# Patient Record
Sex: Male | Born: 1960 | Race: Black or African American | Hispanic: No
Health system: Southern US, Community
[De-identification: ages and names within clinical notes are randomized; demographics above are authoritative.]

## PROBLEM LIST (undated history)

## (undated) ENCOUNTER — Emergency Department (HOSPITAL_BASED_OUTPATIENT_CLINIC_OR_DEPARTMENT_OTHER): Admission: EM | Payer: BLUE CROSS/BLUE SHIELD | Source: Home / Self Care

## (undated) DIAGNOSIS — I82409 Acute embolism and thrombosis of unspecified deep veins of unspecified lower extremity: Secondary | ICD-10-CM

## (undated) DIAGNOSIS — N529 Male erectile dysfunction, unspecified: Secondary | ICD-10-CM

## (undated) DIAGNOSIS — I480 Paroxysmal atrial fibrillation: Secondary | ICD-10-CM

## (undated) DIAGNOSIS — I10 Essential (primary) hypertension: Secondary | ICD-10-CM

## (undated) DIAGNOSIS — M79609 Pain in unspecified limb: Secondary | ICD-10-CM

## (undated) HISTORY — DX: Male erectile dysfunction, unspecified: N52.9

## (undated) HISTORY — DX: Pain in unspecified limb: M79.609

## (undated) HISTORY — PX: OTHER SURGICAL HISTORY: SHX169

## (undated) HISTORY — PX: PROSTATE SURGERY: SHX751

## (undated) HISTORY — DX: Paroxysmal atrial fibrillation: I48.0

---

## 2000-08-22 ENCOUNTER — Emergency Department (HOSPITAL_COMMUNITY): Admission: EM | Admit: 2000-08-22 | Discharge: 2000-08-22 | Payer: Self-pay | Admitting: Emergency Medicine

## 2001-07-25 ENCOUNTER — Emergency Department (HOSPITAL_COMMUNITY): Admission: EM | Admit: 2001-07-25 | Discharge: 2001-07-25 | Payer: Self-pay | Admitting: Emergency Medicine

## 2001-07-25 ENCOUNTER — Encounter: Payer: Self-pay | Admitting: Emergency Medicine

## 2002-03-04 ENCOUNTER — Emergency Department (HOSPITAL_COMMUNITY): Admission: EM | Admit: 2002-03-04 | Discharge: 2002-03-05 | Payer: Self-pay | Admitting: Emergency Medicine

## 2003-12-04 ENCOUNTER — Emergency Department (HOSPITAL_COMMUNITY): Admission: EM | Admit: 2003-12-04 | Discharge: 2003-12-04 | Payer: Self-pay | Admitting: Emergency Medicine

## 2004-09-23 ENCOUNTER — Emergency Department (HOSPITAL_COMMUNITY): Admission: EM | Admit: 2004-09-23 | Discharge: 2004-09-23 | Payer: Self-pay

## 2005-08-29 ENCOUNTER — Ambulatory Visit: Payer: Self-pay | Admitting: Gastroenterology

## 2005-09-13 ENCOUNTER — Ambulatory Visit: Payer: Self-pay | Admitting: Gastroenterology

## 2007-01-29 ENCOUNTER — Ambulatory Visit: Payer: Self-pay | Admitting: Cardiovascular Disease

## 2007-01-29 ENCOUNTER — Ambulatory Visit: Payer: Self-pay

## 2008-06-11 ENCOUNTER — Encounter: Admission: RE | Admit: 2008-06-11 | Discharge: 2008-06-11 | Payer: Self-pay | Admitting: *Deleted

## 2009-03-30 ENCOUNTER — Encounter (INDEPENDENT_AMBULATORY_CARE_PROVIDER_SITE_OTHER): Payer: Self-pay | Admitting: *Deleted

## 2009-11-16 ENCOUNTER — Encounter: Payer: Self-pay | Admitting: Cardiology

## 2009-11-18 ENCOUNTER — Telehealth: Payer: Self-pay | Admitting: Gastroenterology

## 2010-02-05 ENCOUNTER — Emergency Department (HOSPITAL_BASED_OUTPATIENT_CLINIC_OR_DEPARTMENT_OTHER): Admission: EM | Admit: 2010-02-05 | Discharge: 2010-02-05 | Payer: Self-pay | Admitting: Emergency Medicine

## 2010-02-05 ENCOUNTER — Ambulatory Visit: Payer: Self-pay | Admitting: Diagnostic Radiology

## 2010-12-05 NOTE — Letter (Signed)
Summary: Recall Colonoscopy Letter  [facility]  [address] [place], [place] [address]   Phone: [phone]  Fax: [phone]      Mar 30, 2009 MRN: [id number]   Kyle Ryan [address] [place], [place]  [address]   Dear [name],   According to your medical record, it is time for you to schedule a Colonoscopy. The American Cancer Society recommends this procedure as a method to detect early colon cancer. Patients with a family history of colon cancer, or a personal history of colon polyps or inflammatory bowel disease are at increased risk.  This letter has beeen generated based on the recommendations made at the time of your procedure. If you feel that in your particular situation this may no longer apply, please contact our office.  Please call our office at [phone] to schedule this appointment or to update your records at your earliest convenience.  Thank you for cooperating with [place] to provide you with the very best care possible.   Sincerely,  Judie Petit T. Russella Dar, M.D.  [facility] [phone]

## 2010-12-05 NOTE — Progress Notes (Signed)
Summary: Schedule Colonoscopy  Phone Note Outgoing Call Call back at [facility] Phone [phone]   Call placed by: Harlow Mares CMA Duncan Dull),  November 18, 2009 4:51 PM Call placed to: Patient Summary of Call: left message with male and she will have the patient call me back.  Initial call taken by: Harlow Mares CMA Duncan Dull),  November 18, 2009 4:52 PM  Follow-up for Phone Call        Left message on patients machine to call back.  Follow-up by: Harlow Mares CMA Duncan Dull),  November 25, 2009 10:46 AM  Additional Follow-up for Phone Call Additional follow up Details #1::        left message with patients wife again to  call back , she will remind him Additional Follow-up by: Harlow Mares CMA Duncan Dull),  December 01, 2009 2:04 PM

## 2011-02-01 ENCOUNTER — Other Ambulatory Visit: Payer: Self-pay | Admitting: *Deleted

## 2011-02-01 MED ORDER — HYDROCHLOROTHIAZIDE 25 MG PO TABS: 25.0000 mg | ORAL_TABLET | Freq: Every day | ORAL | Status: DC

## 2011-03-09 ENCOUNTER — Other Ambulatory Visit: Payer: Self-pay | Admitting: *Deleted

## 2011-03-09 MED ORDER — HYDROCHLOROTHIAZIDE 25 MG PO TABS: 25.0000 mg | ORAL_TABLET | Freq: Every day | ORAL | Status: DC

## 2011-03-09 NOTE — Telephone Encounter (Signed)
escribe medication per fax request  

## 2011-03-23 NOTE — Procedures (Signed)
[facility]                              EXERCISE TREADMILL   LADARIEN, BEEKS                        MRN:          [id number]  DATE:01/29/2007                            DOB:          07/09/1961    PROCEDURE:  Exercise treadmill stress test.   INDICATIONS FOR PROCEDURE:  Mr. Cropper is a 50 year old African American  male with an abnormal EKG.   INTERPRETATION:  The patient exercised for 12 minutes following the  Bruce protocol.  He achieved 13.7 METs.  His resting heart rate was 85  beats per minute, and peak heart rate was 171 beats per minute.  His  resting blood pressure was 137/101, and peak blood pressure was 224/88.  He had no chest pain or arrhythmia with exercise.  There was 1 to 2  millimeters of horizontal ST depression at peak exercise that resolved  quickly in recovery.   CONCLUSION:  1. Indeterminate for ischemia but overall low risk exercise stress      test in a patient with excellent exercise tolerance and rapid      recovery of borderline ST segment changes in recovery phase.  2. Hypertensive response to exercise.     Veverly Fells. Excell Seltzer, MD  Electronically Signed    MDC/MedQ  DD: 01/31/2007  DT: 01/31/2007  Job #: [id number]

## 2011-03-29 ENCOUNTER — Other Ambulatory Visit: Payer: Self-pay | Admitting: Cardiology

## 2011-03-30 ENCOUNTER — Other Ambulatory Visit: Payer: Self-pay | Admitting: *Deleted

## 2011-03-30 MED ORDER — DILTIAZEM HCL ER COATED BEADS 240 MG PO CP24: 240.0000 mg | ORAL_CAPSULE | Freq: Every day | ORAL | Status: DC

## 2011-03-30 NOTE — Telephone Encounter (Signed)
Refill to [facility]

## 2011-10-04 ENCOUNTER — Observation Stay (HOSPITAL_COMMUNITY)
Admission: EM | Admit: 2011-10-04 | Discharge: 2011-10-05 | Disposition: A | Discharge status: Home / Self Care | Payer: BC Managed Care – PPO | Attending: Internal Medicine | Admitting: Internal Medicine

## 2011-10-04 ENCOUNTER — Encounter: Payer: Self-pay | Admitting: *Deleted

## 2011-10-04 ENCOUNTER — Emergency Department (HOSPITAL_COMMUNITY): Payer: BC Managed Care – PPO

## 2011-10-04 DIAGNOSIS — I4891 Unspecified atrial fibrillation: Secondary | ICD-10-CM | POA: Insufficient documentation

## 2011-10-04 DIAGNOSIS — I82409 Acute embolism and thrombosis of unspecified deep veins of unspecified lower extremity: Principal | ICD-10-CM

## 2011-10-04 DIAGNOSIS — I10 Essential (primary) hypertension: Secondary | ICD-10-CM | POA: Diagnosis present

## 2011-10-04 DIAGNOSIS — Z86718 Personal history of other venous thrombosis and embolism: Secondary | ICD-10-CM | POA: Diagnosis present

## 2011-10-04 HISTORY — DX: Acute embolism and thrombosis of unspecified deep veins of unspecified lower extremity: I82.409

## 2011-10-04 HISTORY — DX: Essential (primary) hypertension: I10

## 2011-10-04 LAB — DIFFERENTIAL
Basophils Absolute: 0 10*3/uL (ref 0.0–0.1)
Basophils Relative: 0 % (ref 0–1)
Eosinophils Absolute: 0.2 10*3/uL (ref 0.0–0.7)
Eosinophils Relative: 3 % (ref 0–5)
Lymphocytes Relative: 25 % (ref 12–46)
Lymphs Abs: 1.2 10*3/uL (ref 0.7–4.0)
Monocytes Absolute: 0.4 10*3/uL (ref 0.1–1.0)
Monocytes Relative: 7 % (ref 3–12)
Neutro Abs: 3.2 10*3/uL (ref 1.7–7.7)
Neutrophils Relative %: 65 % (ref 43–77)

## 2011-10-04 LAB — CBC
HCT: 36.4 % — ABNORMAL LOW (ref 39.0–52.0)
Hemoglobin: 12.7 g/dL — ABNORMAL LOW (ref 13.0–17.0)
MCH: 33.3 pg (ref 26.0–34.0)
MCHC: 34.9 g/dL (ref 30.0–36.0)
MCV: 95.5 fL (ref 78.0–100.0)
Platelets: 127 10*3/uL — ABNORMAL LOW (ref 150–400)
RBC: 3.81 MIL/uL — ABNORMAL LOW (ref 4.22–5.81)
RDW: 12.8 % (ref 11.5–15.5)
WBC: 5 10*3/uL (ref 4.0–10.5)

## 2011-10-04 LAB — POCT I-STAT, CHEM 8
BUN: 12 mg/dL (ref 6–23)
Calcium, Ion: 1.15 mmol/L (ref 1.12–1.32)
Chloride: 108 meq/L (ref 96–112)
Creatinine, Ser: 1.2 mg/dL (ref 0.50–1.35)
Glucose, Bld: 96 mg/dL (ref 70–99)
HCT: 37 % — ABNORMAL LOW (ref 39.0–52.0)
Hemoglobin: 12.6 g/dL — ABNORMAL LOW (ref 13.0–17.0)
Potassium: 3.6 mEq/L (ref 3.5–5.1)
Sodium: 144 mEq/L (ref 135–145)
TCO2: 26 mmol/L (ref 0–100)

## 2011-10-04 LAB — PROTIME-INR
INR: 1.13 (ref 0.00–1.49)
Prothrombin Time: 14.7 seconds (ref 11.6–15.2)

## 2011-10-04 LAB — APTT: aPTT: 33 s (ref 24–37)

## 2011-10-04 MED ORDER — ENOXAPARIN SODIUM 100 MG/ML ~~LOC~~ SOLN
100.0000 mg | Freq: Two times a day (BID) | SUBCUTANEOUS | Status: DC
  Administered 2011-10-05: 100 mg via SUBCUTANEOUS
  Filled 2011-10-04 (×3): qty 1

## 2011-10-04 MED ORDER — SODIUM CHLORIDE 0.9 % IV SOLN
INTRAVENOUS | Status: DC
  Administered 2011-10-04: via INTRAVENOUS

## 2011-10-04 MED ORDER — ONDANSETRON HCL 4 MG/2ML IJ SOLN: 4.0000 mg | Freq: Four times a day (QID) | INTRAMUSCULAR | Status: DC | PRN

## 2011-10-04 MED ORDER — LISINOPRIL 40 MG PO TABS
40.0000 mg | ORAL_TABLET | Freq: Every day | ORAL | Status: DC
  Administered 2011-10-05: 40 mg via ORAL
  Filled 2011-10-04: qty 1

## 2011-10-04 MED ORDER — DILTIAZEM HCL ER COATED BEADS 240 MG PO CP24
240.0000 mg | ORAL_CAPSULE | Freq: Every day | ORAL | Status: DC
  Filled 2011-10-04: qty 1

## 2011-10-04 MED ORDER — ACETAMINOPHEN 325 MG PO TABS: 650.0000 mg | ORAL_TABLET | Freq: Four times a day (QID) | ORAL | Status: DC | PRN

## 2011-10-04 MED ORDER — ACETAMINOPHEN 650 MG RE SUPP: 650.0000 mg | Freq: Four times a day (QID) | RECTAL | Status: DC | PRN

## 2011-10-04 MED ORDER — ONDANSETRON HCL 4 MG PO TABS: 4.0000 mg | ORAL_TABLET | Freq: Four times a day (QID) | ORAL | Status: DC | PRN

## 2011-10-04 MED ORDER — ENOXAPARIN SODIUM 100 MG/ML ~~LOC~~ SOLN
100.0000 mg | Freq: Once | SUBCUTANEOUS | Status: AC
  Administered 2011-10-04: 100 mg via SUBCUTANEOUS
  Filled 2011-10-04 (×2): qty 1

## 2011-10-04 MED ORDER — TAMSULOSIN HCL 0.4 MG PO CAPS: 0.4000 mg | ORAL_CAPSULE | ORAL | Status: DC

## 2011-10-04 MED ORDER — WARFARIN SODIUM 10 MG PO TABS
10.0000 mg | ORAL_TABLET | ORAL | Status: AC
  Administered 2011-10-04: 10 mg via ORAL
  Filled 2011-10-04: qty 1

## 2011-10-04 NOTE — H&P (Addendum)
Kyle Ryan is an 50 y.o. male.   Chief Complaint: Right lower extremity swelling. HPI: A 50 year old male with history of hypertension, atrial fibrillation has noticed swelling in his right lower extremity over the last 3 days had gone to his PCP yesterday and was started on antibiotics empirically for possible cellulitis. He had Doppler of the right lower extremity done today which showed DVT involving the right femoral vein and popliteal vein. He was instructed to come to the ER. Patient denies any chest pain, shortness of breath or any lower extremity pain. Patient denies any recent travel or surgery.  Past Medical History  Diagnosis Date  . DVT (deep venous thrombosis) 10/04/2011  . Hypertension   . Dysrhythmia     Past Surgical History  Procedure Date  . Arthroscopy of knee     Family History  Problem Relation Age of Onset  . Diabetes type II Mother   . Diabetes type II Sister    Social History:  reports that he has been smoking.  He does not have any smokeless tobacco history on file. He reports that he drinks alcohol. He reports that he uses illicit drugs (Marijuana).  Allergies:  Allergies  Allergen Reactions  . Flagyl (Metronidazole Hcl) Rash    Medications Prior to Admission  Medication Dose Route Frequency Provider Last Rate Last Dose  . enoxaparin (LOVENOX) injection 100 mg  100 mg Subcutaneous Once Harrold Donath R. Rubin Payor, MD       Medications Prior to Admission  Medication Sig Dispense Refill  . diltiazem (CARDIZEM CD) 240 MG 24 hr capsule TAKE ONE (1) CAPSULE(S) ONCE DAILY  30 capsule  0    Results for orders placed during the hospital encounter of 10/04/11 (from the past 48 hour(s))  CBC     Status: Abnormal   Collection Time   10/04/11  7:44 PM      Component Value Range Comment   WBC 5.0  4.0 - 10.5 (K/uL)    RBC 3.81 (*) 4.22 - 5.81 (MIL/uL)    Hemoglobin 12.7 (*) 13.0 - 17.0 (g/dL)    HCT 91.4 (*) 78.2 - 52.0 (%)    MCV 95.5  78.0 - 100.0 (fL)    MCH  33.3  26.0 - 34.0 (pg)    MCHC 34.9  30.0 - 36.0 (g/dL)    RDW 95.6  21.3 - 08.6 (%)    Platelets 127 (*) 150 - 400 (K/uL)   DIFFERENTIAL     Status: Normal   Collection Time   10/04/11  7:44 PM      Component Value Range Comment   Neutrophils Relative 65  43 - 77 (%)    Neutro Abs 3.2  1.7 - 7.7 (K/uL)    Lymphocytes Relative 25  12 - 46 (%)    Lymphs Abs 1.2  0.7 - 4.0 (K/uL)    Monocytes Relative 7  3 - 12 (%)    Monocytes Absolute 0.4  0.1 - 1.0 (K/uL)    Eosinophils Relative 3  0 - 5 (%)    Eosinophils Absolute 0.2  0.0 - 0.7 (K/uL)    Basophils Relative 0  0 - 1 (%)    Basophils Absolute 0.0  0.0 - 0.1 (K/uL)   PROTIME-INR     Status: Normal   Collection Time   10/04/11  7:44 PM      Component Value Range Comment   Prothrombin Time 14.7  11.6 - 15.2 (seconds)    INR 1.13  0.00 -  1.49    APTT     Status: Normal   Collection Time   10/04/11  7:44 PM      Component Value Range Comment   aPTT 33  24 - 37 (seconds)   POCT I-STAT, CHEM 8     Status: Abnormal   Collection Time   10/04/11  7:57 PM      Component Value Range Comment   Sodium 144  135 - 145 (mEq/L)    Potassium 3.6  3.5 - 5.1 (mEq/L)    Chloride 108  96 - 112 (mEq/L)    BUN 12  6 - 23 (mg/dL)    Creatinine, Ser 4.09  0.50 - 1.35 (mg/dL)    Glucose, Bld 96  70 - 99 (mg/dL)    Calcium, Ion 8.11  1.12 - 1.32 (mmol/L)    TCO2 26  0 - 100 (mmol/L)    Hemoglobin 12.6 (*) 13.0 - 17.0 (g/dL)    HCT 91.4 (*) 78.2 - 52.0 (%)    Dg Chest 2 View  10/04/2011  *RADIOLOGY REPORT*  Clinical Data: 50 year old male with DVT.  CHEST - 2 VIEW  Comparison: 06/11/2008  Findings: The cardiomediastinal silhouette is unremarkable. The lungs are clear. There is no evidence of focal airspace disease, pulmonary edema, pulmonary nodule/mass, pleural effusion, or pneumothorax. No acute bony abnormalities are identified.  IMPRESSION: No evidence of active cardiopulmonary disease.  Original Report Authenticated By: Rosendo Gros, M.D.     Review of Systems  Constitutional: Negative.   HENT: Negative.   Eyes: Negative.   Respiratory: Negative.   Cardiovascular: Negative.   Gastrointestinal: Negative.   Genitourinary: Negative.   Musculoskeletal:       Swelling in the right lower extremity.  Skin: Negative.   Neurological: Negative.   Endo/Heme/Allergies: Negative.   Psychiatric/Behavioral: Negative.     Blood pressure 117/61, pulse 70, temperature 98.8 F (37.1 C), temperature source Oral, resp. rate 18, weight 103.42 kg (228 lb), SpO2 100.00%. Physical Exam  Constitutional: He is oriented to person, place, and time. He appears well-developed and well-nourished.  HENT:  Head: Normocephalic and atraumatic.  Right Ear: External ear normal.  Left Ear: External ear normal.  Nose: Nose normal.  Mouth/Throat: Oropharynx is clear and moist.  Eyes: Conjunctivae and EOM are normal. Pupils are equal, round, and reactive to light.  Neck: Normal range of motion. Neck supple.  Cardiovascular: Normal rate, regular rhythm, normal heart sounds and intact distal pulses.   Respiratory: Effort normal and breath sounds normal. No respiratory distress. He has no wheezes. He has no rales. He exhibits no tenderness.  GI: Soft. Bowel sounds are normal.  Musculoskeletal:       Swelling right lower extrmity  Neurological: He is alert and oriented to person, place, and time. He has normal reflexes. No cranial nerve deficit. Coordination normal.  Skin: Skin is warm and dry.     Assessment/Plan #1. Right lower extremity DVT involving the right femoral vein and popliteal veins. #2. History of hypertension. #3. History of atrial fibrillation. #4. Ongoing tobacco abuse. #5. Previous history of chronic alcoholism. #6. Mild thromocytopenia.  Plan We'll admit for observation. Patient has been started on Lovenox and we will add Coumadin. Patient is hemodynamically stable. If we can arrange outpatient Lovenox and patient given  teaching about Coumadin patient probably can be discharged home tomorrow if stable, with followup with his primary care physician Dr.Enrico Yetta Barre. We at this time does not know the exact risk factor for his  DVT. He'll need hypercoagulable workup later through his primary care physician. Patient is advised to quit smoking.  Eduard Clos. 10/04/2011, 9:20 PM

## 2011-10-04 NOTE — ED Provider Notes (Signed)
History     CSN: [id number] Arrival date & time: 10/04/2011  4:58 PM   First MD Initiated Contact with Patient 10/04/11 1915      Chief Complaint  Patient presents with  . DVT    (Consider location/radiation/quality/duration/timing/severity/associated sxs/prior treatment) The history is provided by the patient.   patient was [facility] with the diagnosis of the right lower extremity DVT. He has a disc and the report. He's had some swelling in his right lower leg the last several days. He's been on antibiotics. His primary care Dr, Dr. Knox Royalty, and sent him for an outpatient Doppler. Patient has a history of A. fib. He states that he is on Coumadin for it, although his medicine list does not show this. He states he has not had to have it checked in a while. No chest pain. No trouble breathing. No recent travel. He does smoke. Mild pain in his right lower extremity.  Past Medical History  Diagnosis Date  . DVT (deep venous thrombosis) 10/04/2011  . Hypertension     History reviewed. No pertinent past surgical history.  No family history on file.  History  Substance Use Topics  . Smoking status: Not on file  . Smokeless tobacco: Not on file  . Alcohol Use: No      Review of Systems  Constitutional: Negative for activity change and appetite change.  HENT: Negative for neck stiffness.   Eyes: Negative for pain.  Respiratory: Negative for chest tightness and shortness of breath.   Cardiovascular: Negative for chest pain and leg swelling.  Gastrointestinal: Negative for nausea, vomiting, abdominal pain and diarrhea.  Genitourinary: Negative for flank pain.  Musculoskeletal: Negative for back pain, joint swelling and gait problem.  Skin: Positive for color change. Negative for rash and wound.  Neurological: Negative for weakness, numbness and headaches.  Psychiatric/Behavioral: Negative for behavioral problems.    Allergies  Flagyl  Home Medications     Current Outpatient Rx  Name Route Sig Dispense Refill  . DILTIAZEM HCL COATED BEADS 240 MG PO CP24  TAKE ONE (1) CAPSULE(S) ONCE DAILY 30 capsule 0  . DOXYCYCLINE HYCLATE 100 MG PO TABS Oral Take 100 mg by mouth 2 (two) times daily.      Marland Kitchen SILDENAFIL CITRATE 100 MG PO TABS Oral Take 100 mg by mouth daily as needed. Erectile dysfunction      . TAMSULOSIN HCL 0.4 MG PO CAPS Oral Take 0.4 mg by mouth daily after supper.        BP 117/61  Pulse 70  Temp(Src) 98.8 F (37.1 C) (Oral)  Resp 18  Wt 228 lb (103.42 kg)  SpO2 100%  Physical Exam  Nursing note and vitals reviewed. Constitutional: He is oriented to person, place, and time. He appears well-developed and well-nourished.  HENT:  Head: Normocephalic and atraumatic.  Eyes: EOM are normal. Pupils are equal, round, and reactive to light.  Neck: Normal range of motion. Neck supple.  Cardiovascular: Normal rate, regular rhythm and normal heart sounds.   No murmur heard. Pulmonary/Chest: Effort normal and breath sounds normal.  Abdominal: Soft. Bowel sounds are normal. He exhibits no distension and no mass. There is no tenderness. There is no rebound and no guarding.  Musculoskeletal: Normal range of motion. He exhibits edema.       Some edema and redness of right lower leg. Distal pulses intact. Swelling compared to contralateral.  Neurological: He is alert and oriented to person, place, and time.  No cranial nerve deficit.  Skin: Skin is warm and dry.  Psychiatric: He has a normal mood and affect.    ED Course  Procedures (including critical care time)  Labs Reviewed  CBC - Abnormal; Notable for the following:    RBC 3.81 (*)    Hemoglobin 12.7 (*)    HCT 36.4 (*)    Platelets 127 (*)    All other components within normal limits  POCT I-STAT, CHEM 8 - Abnormal; Notable for the following:    Hemoglobin 12.6 (*)    HCT 37.0 (*)    All other components within normal limits  DIFFERENTIAL  PROTIME-INR  APTT  I-STAT, CHEM  8   Dg Chest 2 View  10/04/2011  *RADIOLOGY REPORT*  Clinical Data: 50 year old male with DVT.  CHEST - 2 VIEW  Comparison: 06/11/2008  Findings: The cardiomediastinal silhouette is unremarkable. The lungs are clear. There is no evidence of focal airspace disease, pulmonary edema, pulmonary nodule/mass, pleural effusion, or pneumothorax. No acute bony abnormalities are identified.  IMPRESSION: No evidence of active cardiopulmonary disease.  Original Report Authenticated By: Rosendo Gros, M.D.     1. DVT of lower limb, acute       MDM  Patient was sent in from the imaging center with a DVT in his right lower leg. His PCP ordered a Doppler. He is not having any signs or symptoms of PE. He'll be admitted.        Juliet Rude. Rubin Payor, MD 10/04/11 2108

## 2011-10-04 NOTE — ED Notes (Signed)
Pt was sent here with CD of venous doppler which showed a DVT in his right lower leg.  Pt was initially thought to have a cellulitis but was today found to have a DVT.  No CP or sob with this.

## 2011-10-04 NOTE — Progress Notes (Signed)
ANTICOAGULATION CONSULT NOTE - Initial Consult  Pharmacy Consult for Coumadin Indication: DVT  Allergies  Allergen Reactions  . Flagyl (Metronidazole Hcl) Rash    Patient Measurements: Height: 6\' 4"  (193 cm) Weight: 228 lb (103.42 kg) IBW/kg (Calculated) : 86.8  Adjusted Body Weight:   Vital Signs: Temp: 98.8 F (37.1 C) (11/29 1704) Temp src: Oral (11/29 1704) BP: 138/111 mmHg (11/29 2206) Pulse Rate: 60  (11/29 2206)  Labs:  Basename 10/04/11 1957 10/04/11 1944  HGB 12.6* 12.7*  HCT 37.0* 36.4*  PLT -- 127*  APTT -- 33  LABPROT -- 14.7  INR -- 1.13  HEPARINUNFRC -- --  CREATININE 1.20 --  CKTOTAL -- --  CKMB -- --  TROPONINI -- --   Estimated Creatinine Clearance: 88.4 ml/min (by C-G formula based on Cr of 1.2).  Medical History: Past Medical History  Diagnosis Date  . DVT (deep venous thrombosis) 10/04/2011  . Hypertension   . Dysrhythmia     Medications:  Prescriptions prior to admission  Medication Sig Dispense Refill  . diltiazem (CARDIZEM CD) 240 MG 24 hr capsule TAKE ONE (1) CAPSULE(S) ONCE DAILY  30 capsule  0  . doxycycline (VIBRA-TABS) 100 MG tablet Take 100 mg by mouth 2 (two) times daily.        . sildenafil (VIAGRA) 100 MG tablet Take 100 mg by mouth daily as needed. Erectile dysfunction        . Tamsulosin HCl (FLOMAX) 0.4 MG CAPS Take 0.4 mg by mouth daily after supper.          Assessment: Newly diagnosed R LE DVT. Noted MD started Lovenox 105mg  sq q12, received orders to start Coumadin. Noted INR is WNL at baseline.  Goal of Therapy:  INR 2-3   Plan: 1. Coumadin 10mg  PO x 1 now 2. Will f/up daily INR 3. Would continue lovenox until INR >2 and overlap for 2 additional days.  Jaionna Weisse K. Allena Katz, PharmD, BCPS.  Clinical Pharmacist Pager [phone]. 10/04/2011 10:36 PM

## 2011-10-05 ENCOUNTER — Encounter (HOSPITAL_COMMUNITY): Payer: Self-pay | Admitting: *Deleted

## 2011-10-05 LAB — CBC
HCT: 37.4 % — ABNORMAL LOW (ref 39.0–52.0)
Hemoglobin: 12.5 g/dL — ABNORMAL LOW (ref 13.0–17.0)
MCH: 31.9 pg (ref 26.0–34.0)
MCHC: 33.4 g/dL (ref 30.0–36.0)
MCV: 95.4 fL (ref 78.0–100.0)
Platelets: 132 10*3/uL — ABNORMAL LOW (ref 150–400)
RBC: 3.92 MIL/uL — ABNORMAL LOW (ref 4.22–5.81)
RDW: 12.8 % (ref 11.5–15.5)
WBC: 4.6 10*3/uL (ref 4.0–10.5)

## 2011-10-05 LAB — COMPREHENSIVE METABOLIC PANEL
ALT: 17 U/L (ref 0–53)
AST: 22 U/L (ref 0–37)
Albumin: 3.4 g/dL — ABNORMAL LOW (ref 3.5–5.2)
Alkaline Phosphatase: 70 U/L (ref 39–117)
BUN: 10 mg/dL (ref 6–23)
CO2: 25 meq/L (ref 19–32)
Calcium: 8.6 mg/dL (ref 8.4–10.5)
Chloride: 108 mEq/L (ref 96–112)
Creatinine, Ser: 0.99 mg/dL (ref 0.50–1.35)
GFR calc Af Amer: >90 mL/min (ref >90)
GFR calc non Af Amer: >90 mL/min (ref >90)
Glucose, Bld: 82 mg/dL (ref 70–99)
Potassium: 3.4 mEq/L — ABNORMAL LOW (ref 3.5–5.1)
Sodium: 144 meq/L (ref 135–145)
Total Bilirubin: 0.6 mg/dL (ref 0.3–1.2)
Total Protein: 6.7 g/dL (ref 6.0–8.3)

## 2011-10-05 LAB — TSH: TSH: 1.522 u[IU]/mL (ref 0.350–4.500)

## 2011-10-05 LAB — PROTIME-INR
INR: 1.18 (ref 0.00–1.49)
Prothrombin Time: 15.2 s (ref 11.6–15.2)

## 2011-10-05 LAB — GLUCOSE, CAPILLARY: Glucose-Capillary: 77 mg/dL (ref 70–99)

## 2011-10-05 MED ORDER — RIVAROXABAN 15 MG PO TABS: 15.0000 mg | ORAL_TABLET | Freq: Every day | ORAL | Status: DC

## 2011-10-05 MED ORDER — RIVAROXABAN 15 MG PO TABS
15.0000 mg | ORAL_TABLET | Freq: Two times a day (BID) | ORAL | Status: DC
  Administered 2011-10-05: 15 mg via ORAL
  Filled 2011-10-05 (×2): qty 1

## 2011-10-05 MED ORDER — WARFARIN SODIUM 10 MG PO TABS
10.0000 mg | ORAL_TABLET | Freq: Once | ORAL | Status: DC
  Filled 2011-10-05: qty 1

## 2011-10-05 MED ORDER — RIVAROXABAN 20 MG PO TABS: 20.0000 mg | ORAL_TABLET | Freq: Every day | ORAL | Status: DC

## 2011-10-05 MED ORDER — RIVAROXABAN 15 MG PO TABS: 15.0000 mg | ORAL_TABLET | Freq: Two times a day (BID) | ORAL | Status: DC

## 2011-10-05 MED ORDER — LISINOPRIL 40 MG PO TABS: 40.0000 mg | ORAL_TABLET | Freq: Every day | ORAL | Status: DC

## 2011-10-05 NOTE — Discharge Summary (Signed)
Discharge Summary  Kyle Ryan MR#: [id number]  DOB:1960-11-17  Date of Admission: 10/04/2011 Date of Discharge: 10/05/2011  Patient's PCP: Paulino Rily, MD  Attending Physician:Charlyn Vialpando A  Consults: None  Discharge Diagnoses: Principal Problem:  *DVT (deep venous thrombosis) Active Problems:  HTN (hypertension)  A-fib  Brief Admitting History and Physical 50 year old male with history of hypertension, A. fib who presents with right lower extremity DVT.  Discharge Medications Current Discharge Medication List    START taking these medications   Details  lisinopril (PRINIVIL,ZESTRIL) 40 MG tablet Take 1 tablet (40 mg total) by mouth daily. Home medication.  Resume at home dose.    !! Rivaroxaban (XARELTO) 15 MG TABS tablet Take 1 tablet (15 mg total) by mouth 2 (two) times daily. Take 15 mg by mouth twice daily for 3 weeks then 20 mg daily thereafter. Qty: 32 tablet, Refills: 0    !! Rivaroxaban (XARELTO) 15 MG TABS tablet Take 1 tablet (15 mg total) by mouth daily. Take 15 mg by mouth twice daily for 3 weeks then 20 mg daily thereafter. This prescription assessment for free 5 day trial. Qty: 10 tablet, Refills: 0    !! Rivaroxaban 20 MG TABS Take 20 mg by mouth daily. Take 15 mg by mouth twice daily initially for 3 weeks then 20 mg daily thereafter. This prescription is for 20 mg after completing 3 weeks course of 15 mg by mouth twice daily. Qty: 30 tablet, Refills: 0     !! - Potential duplicate medications found. Please discuss with provider.    CONTINUE these medications which have NOT CHANGED   Details  diltiazem (CARDIZEM CD) 240 MG 24 hr capsule TAKE ONE (1) CAPSULE(S) ONCE DAILY Qty: 30 capsule, Refills: 0    doxycycline (VIBRA-TABS) 100 MG tablet Take 100 mg by mouth 2 (two) times daily.      sildenafil (VIAGRA) 100 MG tablet Take 100 mg by mouth daily as needed. Erectile dysfunction      Tamsulosin HCl (FLOMAX) 0.4 MG CAPS Take 0.4 mg by mouth  daily after supper.          Hospital Course: DVT (deep venous thrombosis) Present on Admission:  .DVT (deep venous thrombosis) .HTN (hypertension) .A-fib  Patient was admitted to telemetry.  Patient had most lower extremity dopplers which showed DVT involving right femoral vein and popliteal vein.  I do not have results of the procedure available for my review. Initially was started on subcutaneous enoxaparin and Coumadin.  Both is enoxaparin and coumadin was discontinued.  Patient was started on Rivaroxaban.  Patient will take Rivaroxaban 15 mg by mouth twice daily for 21 days then 20 mg thereafter.  Patient was instructed to follow up with his primary care physician in 1 week.  Day of Discharge BP 159/91  Pulse 79  Temp(Src) 98.1 F (36.7 C) (Oral)  Resp 14  Ht 6\' 4"  (1.93 m)  Wt 103.42 kg (228 lb)  BMI 27.75 kg/m2  SpO2 95%  Results for orders placed during the hospital encounter of 10/04/11 (from the past 48 hour(s))  CBC     Status: Abnormal   Collection Time   10/04/11  7:44 PM      Component Value Range Comment   WBC 5.0  4.0 - 10.5 (K/uL)    RBC 3.81 (*) 4.22 - 5.81 (MIL/uL)    Hemoglobin 12.7 (*) 13.0 - 17.0 (g/dL)    HCT 40.9 (*) 81.1 - 52.0 (%)    MCV 95.5  78.0 -  100.0 (fL)    MCH 33.3  26.0 - 34.0 (pg)    MCHC 34.9  30.0 - 36.0 (g/dL)    RDW 82.9  56.2 - 13.0 (%)    Platelets 127 (*) 150 - 400 (K/uL)   DIFFERENTIAL     Status: Normal   Collection Time   10/04/11  7:44 PM      Component Value Range Comment   Neutrophils Relative 65  43 - 77 (%)    Neutro Abs 3.2  1.7 - 7.7 (K/uL)    Lymphocytes Relative 25  12 - 46 (%)    Lymphs Abs 1.2  0.7 - 4.0 (K/uL)    Monocytes Relative 7  3 - 12 (%)    Monocytes Absolute 0.4  0.1 - 1.0 (K/uL)    Eosinophils Relative 3  0 - 5 (%)    Eosinophils Absolute 0.2  0.0 - 0.7 (K/uL)    Basophils Relative 0  0 - 1 (%)    Basophils Absolute 0.0  0.0 - 0.1 (K/uL)   PROTIME-INR     Status: Normal   Collection Time    10/04/11  7:44 PM      Component Value Range Comment   Prothrombin Time 14.7  11.6 - 15.2 (seconds)    INR 1.13  0.00 - 1.49    APTT     Status: Normal   Collection Time   10/04/11  7:44 PM      Component Value Range Comment   aPTT 33  24 - 37 (seconds)   POCT I-STAT, CHEM 8     Status: Abnormal   Collection Time   10/04/11  7:57 PM      Component Value Range Comment   Sodium 144  135 - 145 (mEq/L)    Potassium 3.6  3.5 - 5.1 (mEq/L)    Chloride 108  96 - 112 (mEq/L)    BUN 12  6 - 23 (mg/dL)    Creatinine, Ser 8.65  0.50 - 1.35 (mg/dL)    Glucose, Bld 96  70 - 99 (mg/dL)    Calcium, Ion 7.84  1.12 - 1.32 (mmol/L)    TCO2 26  0 - 100 (mmol/L)    Hemoglobin 12.6 (*) 13.0 - 17.0 (g/dL)    HCT 69.6 (*) 29.5 - 52.0 (%)   COMPREHENSIVE METABOLIC PANEL     Status: Abnormal   Collection Time   10/05/11  4:50 AM      Component Value Range Comment   Sodium 144  135 - 145 (mEq/L)    Potassium 3.4 (*) 3.5 - 5.1 (mEq/L)    Chloride 108  96 - 112 (mEq/L)    CO2 25  19 - 32 (mEq/L)    Glucose, Bld 82  70 - 99 (mg/dL)    BUN 10  6 - 23 (mg/dL)    Creatinine, Ser 2.84  0.50 - 1.35 (mg/dL)    Calcium 8.6  8.4 - 10.5 (mg/dL)    Total Protein 6.7  6.0 - 8.3 (g/dL)    Albumin 3.4 (*) 3.5 - 5.2 (g/dL)    AST 22  0 - 37 (U/L)    ALT 17  0 - 53 (U/L)    Alkaline Phosphatase 70  39 - 117 (U/L)    Total Bilirubin 0.6  0.3 - 1.2 (mg/dL)    GFR calc non Af Amer >90  >90 (mL/min)    GFR calc Af Amer >90  >90 (mL/min)   CBC  Status: Abnormal   Collection Time   10/05/11  4:50 AM      Component Value Range Comment   WBC 4.6  4.0 - 10.5 (K/uL)    RBC 3.92 (*) 4.22 - 5.81 (MIL/uL)    Hemoglobin 12.5 (*) 13.0 - 17.0 (g/dL)    HCT 01.0 (*) 27.2 - 52.0 (%)    MCV 95.4  78.0 - 100.0 (fL)    MCH 31.9  26.0 - 34.0 (pg)    MCHC 33.4  30.0 - 36.0 (g/dL)    RDW 53.6  64.4 - 03.4 (%)    Platelets 132 (*) 150 - 400 (K/uL)   TSH     Status: Normal   Collection Time   10/05/11  4:50 AM       Component Value Range Comment   TSH 1.522  0.350 - 4.500 (uIU/mL)   PROTIME-INR     Status: Normal   Collection Time   10/05/11  4:50 AM      Component Value Range Comment   Prothrombin Time 15.2  11.6 - 15.2 (seconds)    INR 1.18  0.00 - 1.49    GLUCOSE, CAPILLARY     Status: Normal   Collection Time   10/05/11 11:06 AM      Component Value Range Comment   Glucose-Capillary 77  70 - 99 (mg/dL)    Comment 1 Documented in Chart      Comment 2 Notify RN       Dg Chest 2 View  10/04/2011  *RADIOLOGY REPORT*  Clinical Data: 49 year old male with DVT.  CHEST - 2 VIEW  Comparison: 06/11/2008  Findings: The cardiomediastinal silhouette is unremarkable. The lungs are clear. There is no evidence of focal airspace disease, pulmonary edema, pulmonary nodule/mass, pleural effusion, or pneumothorax. No acute bony abnormalities are identified.  IMPRESSION: No evidence of active cardiopulmonary disease.  Original Report Authenticated By: Rosendo Gros, M.D.     Disposition: Home  Diet: Heart healthy  Activity: Resume as tolerated   Follow-up Appts: Discharge Orders    Future Orders Please Complete By Expires   Diet - low sodium heart healthy      Increase activity slowly      Discharge instructions      Comments:   Follow up with Paulino Rily, MD (PCP) in 1 week.       TESTS THAT NEED FOLLOW-UP None, consider doing a hypercoagulable workup after the patient is complete his course of anticoagulation for his DVT.  Time spent on discharge, talking to the patient, and coordinating care: 25 mins.   Signed: Cristal Ford, MD 10/05/2011, 1:50 PM

## 2011-10-05 NOTE — Progress Notes (Signed)
Pharmacy- Xarelto 50yo male known to pharmacy as we have been dosing his Lovenox/Coumadin for (+)DVT, now to change to Xarelto.  He has been initiated on Xarelto 15mg  bid, which should be continued for 3 weeks & then changed to 20mg  daily.  In reviewing his concurrent medications, Diltiazem is noted as possibly causing increased Rivaroxaban effect.  It is not listed as a medication that concurrent use should be absolutely avoided.    1.  DC daily INRs 2.  F/U AM

## 2011-10-05 NOTE — Progress Notes (Signed)
Utilization review complete 

## 2011-10-05 NOTE — Progress Notes (Signed)
Pt admitted with swelling rt lower leg positive for DVT.

## 2011-10-05 NOTE — Progress Notes (Signed)
   CARE MANAGEMENT NOTE 10/05/2011  Patient:  Kyle Ryan, Kyle Ryan   Account Number:  [id number]  Date Initiated:  10/05/2011  Documentation initiated by:  Donn Pierini  Subjective/Objective Assessment:   Pt admitted with DVT     Action/Plan:   PTA pt lived at home, was independent with ADLs   Anticipated DC Date:  10/05/2011   Anticipated DC Plan:  HOME/SELF CARE      DC Planning Services  CM consult      Choice offered to / List presented to:             Status of service:  Completed, signed off Medicare Important Message given?   (If response is "NO", the following Medicare IM given date fields will be blank) Date Medicare IM given:   Date Additional Medicare IM given:    Discharge Disposition:  HOME/SELF CARE  Per UR Regulation:  Reviewed for med. necessity/level of care/duration of stay  Comments:  PCP- EYetta Barre  10/05/11- 1300- Donn Pierini RN, BSN [phone] Pt for discharge today, MD would like to send pt home on Xarelto. Benefits check done for insurance coverage of drug. Pt does have insurance coverage at a $50.00 co-pay for Xarelto. MD informed of coverage. Spoke with pt at bedside regarding Xarelto- free trial card given to pt, along with savings card. Instructed pt to call [phone] number on card to speak with Xarelto rep regarding savings program. Pt states that he can do the $50 co-pay but is appreciative of the savings cards. MD to give pt both a 5 day and 30 day prescription for Xarelto. Called pt's drug store- [facility] to confirm they have Xarelto in stock- let pt know that drug is in stock at [facility] on [address].

## 2011-10-05 NOTE — Progress Notes (Signed)
ANTICOAGULATION CONSULT NOTE - Follow Up Consult  Pharmacy Consult for Coumadin Indication: DVT  Allergies  Allergen Reactions  . Flagyl (Metronidazole Hcl) Rash    Patient Measurements: Height: 6\' 4"  (193 cm) Weight: 228 lb (103.42 kg) IBW/kg (Calculated) : 86.8    Vital Signs: Temp: 98.1 F (36.7 C) (11/30 0500) BP: 159/91 mmHg (11/29 2214) Pulse Rate: 79  (11/30 0500)  Labs:  Basename 10/05/11 0450 10/04/11 1957 10/04/11 1944  HGB 12.5* 12.6* --  HCT 37.4* 37.0* 36.4*  PLT 132* -- 127*  APTT -- -- 33  LABPROT 15.2 -- 14.7  INR 1.18 -- 1.13  HEPARINUNFRC -- -- --  CREATININE 0.99 1.20 --  CKTOTAL -- -- --  CKMB -- -- --  TROPONINI -- -- --   Estimated Creatinine Clearance: 107.2 ml/min (by C-G formula based on Cr of 0.99).   Medications:  Scheduled:    . diltiazem  240 mg Oral Daily  . enoxaparin  100 mg Subcutaneous Once  . enoxaparin (LOVENOX) injection  100 mg Subcutaneous BID  . lisinopril  40 mg Oral Daily  . Tamsulosin HCl  0.4 mg Oral PC supper  . warfarin  10 mg Oral NOW   Goal of Therapy:  INR 2-3   Assessment/Plan:  50yo male with new RLE DVT, completing day #1 of 5 minimum overlap of Lovenox/Coumadin.  INR 1.18 this AM.  CBC stable.  No problems noted.  1.  Coumadin 10mg  2.  Continue daily protime.  Kyle Ryan P 10/05/2011,10:00 AM

## 2011-10-05 NOTE — Progress Notes (Signed)
IV d/c'ed. Tele d/c'ed. D/c instructions and medications discussed with patient and family.  All questions answered. Patient states understanding. Patient escorted out with staff. Patient does not appear in any distress. Patient d/c'ed to home.

## 2011-10-05 NOTE — Progress Notes (Signed)
Subjective: Reports that his right leg is better.  Wondering when he can go home.  Objective: Vital signs in last 24 hours: Filed Vitals:   10/04/11 2011 10/04/11 2206 10/04/11 2214 10/05/11 0500  BP: 117/61 138/111 159/91   Pulse: 70 60 56 79  Temp:   98 F (36.7 C) 98.1 F (36.7 C)  TempSrc:      Resp: 18 17 15 14   Height:   6\' 4"  (1.93 m)   Weight: 103.42 kg (228 lb)     SpO2: 100% 100% 98% 95%   Weight change:   Intake/Output Summary (Last 24 hours) at 10/05/11 1321 Last data filed at 10/05/11 0700  Gross per 24 hour  Intake  367.5 ml  Output      0 ml  Net  367.5 ml   Physical Exam: General: Awake, Oriented, No acute distress. HEENT: EOMI. Neck: Supple CV: S1 and S2 Lungs: Clear to ascultation bilaterally Abdomen: Soft, Nontender, Nondistended, +bowel sounds. Ext: Good pulses. Trace edema.  Right like swollen.  Minimal erythema and warmth.  Lab Results:  [facility] 10/05/11 0450 10/04/11 1957  NA 144 144  K 3.4* 3.6  CL 108 108  CO2 25 --  GLUCOSE 82 96  BUN 10 12  CREATININE 0.99 1.20  CALCIUM 8.6 --  MG -- --  PHOS -- --    Basename 10/05/11 0450  AST 22  ALT 17  ALKPHOS 70  BILITOT 0.6  PROT 6.7  ALBUMIN 3.4*   No results found for this basename: LIPASE:2,AMYLASE:2 in the last 72 hours  Basename 10/05/11 0450 10/04/11 1957 10/04/11 1944  WBC 4.6 -- 5.0  NEUTROABS -- -- 3.2  HGB 12.5* 12.6* --  HCT 37.4* 37.0* --  MCV 95.4 -- 95.5  PLT 132* -- 127*   No results found for this basename: CKTOTAL:3,CKMB:3,CKMBINDEX:3,TROPONINI:3 in the last 72 hours No results found for this basename: POCBNP:3 in the last 72 hours No results found for this basename: DDIMER:2 in the last 72 hours No results found for this basename: HGBA1C:2 in the last 72 hours No results found for this basename: CHOL:2,HDL:2,LDLCALC:2,TRIG:2,CHOLHDL:2,LDLDIRECT:2 in the last 72 hours  Basename 10/05/11 0450  TSH 1.522  T4TOTAL --  T3FREE --  THYROIDAB --   No results  found for this basename: VITAMINB12:2,FOLATE:2,FERRITIN:2,TIBC:2,IRON:2,RETICCTPCT:2 in the last 72 hours  Micro Results: No results found for this or any previous visit (from the past 240 hour(s)).  Studies/Results: Dg Chest 2 View  10/04/2011  *RADIOLOGY REPORT*  Clinical Data: 50 year old male with DVT.  CHEST - 2 VIEW  Comparison: 06/11/2008  Findings: The cardiomediastinal silhouette is unremarkable. The lungs are clear. There is no evidence of focal airspace disease, pulmonary edema, pulmonary nodule/mass, pleural effusion, or pneumothorax. No acute bony abnormalities are identified.  IMPRESSION: No evidence of active cardiopulmonary disease.  Original Report Authenticated By: Rosendo Gros, M.D.    Medications: I have reviewed the patient's current medications. Scheduled Meds:    . diltiazem  240 mg Oral Daily  . enoxaparin  100 mg Subcutaneous Once  . lisinopril  40 mg Oral Daily  . rivaroxaban  15 mg Oral BID  . Tamsulosin HCl  0.4 mg Oral PC supper  . warfarin  10 mg Oral NOW  . DISCONTD: enoxaparin (LOVENOX) injection  100 mg Subcutaneous BID  . DISCONTD: warfarin  10 mg Oral ONCE-1800   Continuous Infusions:   . DISCONTD: sodium chloride 50 mL/hr at 10/04/11 2339   PRN Meds:.acetaminophen, acetaminophen, ondansetron (ZOFRAN)  IV, ondansetron  Assessment/Plan: Principal Problem:  *DVT (deep venous thrombosis) Active Problems:  HTN (hypertension)  A-fib  Discontinue Lovenox and Coumadin.  Will discharge the patient on Rivaroxaban.  Will have the patient follow up with his primary care physician in 1 week.   LOS: 1 day  Deray Dawes A, MD 10/05/2011, 1:21 PM

## 2011-11-02 ENCOUNTER — Other Ambulatory Visit: Payer: Self-pay | Admitting: Internal Medicine

## 2011-11-12 ENCOUNTER — Ambulatory Visit (HOSPITAL_COMMUNITY)
Admission: RE | Admit: 2011-11-12 | Discharge: 2011-11-12 | Disposition: A | Discharge status: Home / Self Care | Payer: BC Managed Care – PPO | Source: Ambulatory Visit | Attending: Internal Medicine | Admitting: Internal Medicine

## 2011-11-12 ENCOUNTER — Encounter (HOSPITAL_COMMUNITY)
Admission: RE | Disposition: A | Discharge status: Home / Self Care | Payer: Self-pay | Source: Ambulatory Visit | Attending: Internal Medicine

## 2011-11-12 ENCOUNTER — Other Ambulatory Visit: Payer: Self-pay

## 2011-11-12 DIAGNOSIS — I498 Other specified cardiac arrhythmias: Secondary | ICD-10-CM | POA: Insufficient documentation

## 2011-11-12 DIAGNOSIS — I4510 Unspecified right bundle-branch block: Secondary | ICD-10-CM | POA: Insufficient documentation

## 2011-11-12 SURGERY — CARDIOVERSION
Anesthesia: Monitor Anesthesia Care

## 2011-11-12 NOTE — Progress Notes (Signed)
Procedure cancelled per Dr. Rennis Golden d/t pt in NSR. Prescription for Lesia Hausen given to pt.

## 2011-11-12 NOTE — Procedures (Signed)
Mr. Nannini presented for a planned cardioversion today, however, the EKG demonstrates sinus rhythm. He is obviously paroxysmal.  We will continue xarelto and I have given him a prescription for 20 mg with refills today.  He will be discharged with office follow-up.  Chrystie Nose, MD Attending Cardiologist [facility]

## 2012-03-08 ENCOUNTER — Ambulatory Visit (INDEPENDENT_AMBULATORY_CARE_PROVIDER_SITE_OTHER): Payer: BC Managed Care – PPO | Admitting: Emergency Medicine

## 2012-03-08 ENCOUNTER — Ambulatory Visit: Payer: BC Managed Care – PPO

## 2012-03-08 VITALS — BP 144/84 | HR 72 | Temp 97.4°F | Resp 16 | Ht 77.0 in | Wt 225.0 lb

## 2012-03-08 DIAGNOSIS — R5383 Other fatigue: Secondary | ICD-10-CM

## 2012-03-08 DIAGNOSIS — R05 Cough: Secondary | ICD-10-CM

## 2012-03-08 DIAGNOSIS — K5900 Constipation, unspecified: Secondary | ICD-10-CM

## 2012-03-08 DIAGNOSIS — R350 Frequency of micturition: Secondary | ICD-10-CM

## 2012-03-08 DIAGNOSIS — K219 Gastro-esophageal reflux disease without esophagitis: Secondary | ICD-10-CM

## 2012-03-08 DIAGNOSIS — J029 Acute pharyngitis, unspecified: Secondary | ICD-10-CM

## 2012-03-08 DIAGNOSIS — M25551 Pain in right hip: Secondary | ICD-10-CM

## 2012-03-08 DIAGNOSIS — Z113 Encounter for screening for infections with a predominantly sexual mode of transmission: Secondary | ICD-10-CM

## 2012-03-08 DIAGNOSIS — I10 Essential (primary) hypertension: Secondary | ICD-10-CM

## 2012-03-08 DIAGNOSIS — R351 Nocturia: Secondary | ICD-10-CM

## 2012-03-08 DIAGNOSIS — R059 Cough, unspecified: Secondary | ICD-10-CM

## 2012-03-08 DIAGNOSIS — M25559 Pain in unspecified hip: Secondary | ICD-10-CM

## 2012-03-08 LAB — TSH: TSH: 1.071 u[IU]/mL (ref 0.350–4.500)

## 2012-03-08 LAB — HEPATITIS C ANTIBODY: HCV Ab: NEGATIVE

## 2012-03-08 LAB — POCT URINALYSIS DIPSTICK
Bilirubin, UA: NEGATIVE
Blood, UA: NEGATIVE
Glucose, UA: NEGATIVE
Ketones, UA: NEGATIVE
Leukocytes, UA: NEGATIVE
Nitrite, UA: NEGATIVE
Protein, UA: NEGATIVE
Spec Grav, UA: 1.02
Urobilinogen, UA: 0.2
pH, UA: 6

## 2012-03-08 LAB — POCT CBC
Granulocyte percent: 58.9 % (ref 37–80)
HCT, POC: 43.5 % (ref 43.5–53.7)
Hemoglobin: 15.3 g/dL (ref 14.1–18.1)
Lymph, poc: 1.1 (ref 0.6–3.4)
MCH, POC: 33.3 pg — AB (ref 27–31.2)
MCHC: 35.2 g/dL (ref 31.8–35.4)
MCV: 94.8 fL (ref 80–97)
MID (cbc): 3 — AB (ref 0–0.9)
MPV: 9.1 fL (ref 0–99.8)
POC Granulocyte: 2 (ref 2–6.9)
POC LYMPH PERCENT: 32.1 %L (ref 10–50)
POC MID %: 9 % (ref 0–12)
Platelet Count, POC: 161 10*3/uL (ref 142–424)
RBC: 4.59 M/uL — AB (ref 4.69–6.13)
RDW, POC: 13.5 %
WBC: 3.4 10*3/uL — AB (ref 4.6–10.2)

## 2012-03-08 LAB — COMPREHENSIVE METABOLIC PANEL
ALT: 24 U/L (ref 0–53)
AST: 31 U/L (ref 0–37)
Albumin: 4.6 g/dL (ref 3.5–5.2)
Alkaline Phosphatase: 64 U/L (ref 39–117)
BUN: 14 mg/dL (ref 6–23)
CO2: 28 mEq/L (ref 19–32)
Calcium: 9.2 mg/dL (ref 8.4–10.5)
Chloride: 107 meq/L (ref 96–112)
Creat: 1.17 mg/dL (ref 0.50–1.35)
Glucose, Bld: 97 mg/dL (ref 70–99)
Potassium: 4.4 meq/L (ref 3.5–5.3)
Sodium: 141 meq/L (ref 135–145)
Total Bilirubin: 0.7 mg/dL (ref 0.3–1.2)
Total Protein: 7.2 g/dL (ref 6.0–8.3)

## 2012-03-08 LAB — POCT UA - MICROSCOPIC ONLY
Casts, Ur, LPF, POC: NEGATIVE
Crystals, Ur, HPF, POC: NEGATIVE
Mucus, UA: NEGATIVE
RBC, urine, microscopic: NEGATIVE
Yeast, UA: NEGATIVE

## 2012-03-08 LAB — POCT SEDIMENTATION RATE: POCT SED RATE: 12 mm/hr (ref 0–22)

## 2012-03-08 LAB — GLUCOSE, POCT (MANUAL RESULT ENTRY): POC Glucose: 91

## 2012-03-08 LAB — IFOBT (OCCULT BLOOD): IFOBT: NEGATIVE

## 2012-03-08 LAB — HEPATITIS B SURFACE ANTIBODY, QUANTITATIVE: Hep B S AB Quant (Post): 1.1 m[IU]/mL

## 2012-03-08 LAB — LIPID PANEL
Cholesterol: 181 mg/dL (ref 0–200)
HDL: 60 mg/dL (ref >39)
LDL Cholesterol: 106 mg/dL — ABNORMAL HIGH (ref 0–99)
Total CHOL/HDL Ratio: 3 {ratio}
Triglycerides: 73 mg/dL (ref <150)
VLDL: 15 mg/dL (ref 0–40)

## 2012-03-08 LAB — HEPATITIS B SURFACE ANTIGEN: Hepatitis B Surface Ag: NEGATIVE

## 2012-03-08 LAB — RPR

## 2012-03-08 MED ORDER — BENZONATATE 100 MG PO CAPS: 100.0000 mg | ORAL_CAPSULE | Freq: Three times a day (TID) | ORAL | Status: AC | PRN

## 2012-03-08 MED ORDER — IBUPROFEN 800 MG PO TABS: 800.0000 mg | ORAL_TABLET | Freq: Every day | ORAL | Status: DC | PRN

## 2012-03-08 NOTE — Patient Instructions (Addendum)
Simethicone, the product in Mylanta [employer], can help with gas. Take your Nexium EVERY DAY for the next 4 weeks, as some of your cough and throat symptoms may be caused by atypical reflux (called Laryngopharyngeal reflux). Increase your water intake to at least 64 ounces daily.  Increase the fiber in your diet (whole grains, fruits and vegetables). Use Miralax each day to soften your stools. If you have not heard from me about the remaining labs in 2 weeks, please call the office.

## 2012-03-08 NOTE — Progress Notes (Signed)
Subjective:    Patient ID: Kyle Ryan, male    DOB: 05-30-1961, 51 y.o.   MRN: [id number]  HPI  Presents with multiple complaints, and desires a "Physical" today. Also desires HIV testing and cholesterol testing. For about 4 weeks, at night, feels sweaty.  Throat sore.  Dry cough. OTC products without much benefit. Urinary frequency.  No dysuria, no hematuria.  Nocturia x "seems like a million."  One male sex partner x 8 years.  Desires HIV testing "for peace of mind." Sister has diabetes.  Review of Systems Bilateral hip pain, intermittently, ibuprofen works, only needs it about once a month.  Requests prescription. Some constipation. Lots of gas. Both increased thirst and hunger. No dizziness, but occasionally off balance. No chills, no documented fever. No chest pain, SOB, HA, nausea, vomiting. No rash.    Objective:   Physical Exam Vital signs noted. Well-developed, well nourished BM who is awake, alert and oriented, in NAD. HEENT: [place]/AT, PERRL, EOMI.  Sclera and conjunctiva are clear.  EAC are patent, TMs are normal in appearance. Nasal mucosa is pink and moist. OP is clear. Neck: supple, non-tender, no lymphadenopathey, thyromegaly. Heart: RRR, no murmur Lungs: CTA Abdomen: normo-active bowel sounds, supple, non-tender, no mass or organomegaly. Extremities: no cyanosis, clubbing or edema. Skin: warm and dry without rash. Genitalia: normal male circumcised genitalia.  No testicular tenderness or mass.  No lesions.  No hernia.  Prostate is mildly enlarged and firm, without nodules, and non-tender.  Results for orders placed in visit on 03/08/12  POCT CBC      Component Value Range   WBC 3.4 (*) 4.6 - 10.2 (K/uL)   Lymph, poc 1.1  0.6 - 3.4    POC LYMPH PERCENT 32.1  10 - 50 (%L)   MID (cbc) 3 (*) 0 - 0.9    POC MID % 9.0  0 - 12 (%M)   POC Granulocyte 2.0  2 - 6.9    Granulocyte percent 58.9  37 - 80 (%G)   RBC 4.59 (*) 4.69 - 6.13 (M/uL)   Hemoglobin 15.3  14.1 - 18.1  (g/dL)   HCT, POC 40.9  81.1 - 53.7 (%)   MCV 94.8  80 - 97 (fL)   MCH, POC 33.3 (*) 27 - 31.2 (pg)   MCHC 35.2  31.8 - 35.4 (g/dL)   RDW, POC 91.4     Platelet Count, POC 161  142 - 424 (K/uL)   MPV 9.1  0 - 99.8 (fL)  GLUCOSE, POCT (MANUAL RESULT ENTRY)      Component Value Range   POC Glucose 91    POCT UA - MICROSCOPIC ONLY      Component Value Range   WBC, Ur, HPF, POC 0-1     RBC, urine, microscopic neg     Bacteria, U Microscopic trace     Mucus, UA neg     Epithelial cells, urine per micros 0-1     Crystals, Ur, HPF, POC neg     Casts, Ur, LPF, POC neg     Yeast, UA neg    IFOBT (OCCULT BLOOD)      Component Value Range   IFOBT Negative    POCT URINALYSIS DIPSTICK      Component Value Range   Color, UA yellow     Clarity, UA clear     Glucose, UA neg     Bilirubin, UA neg     Ketones, UA neg     Spec  Grav, UA 1.020     Blood, UA neg     pH, UA 6.0     Protein, UA neg     Urobilinogen, UA 0.2     Nitrite, UA neg     Leukocytes, UA Negative    POCT SEDIMENTATION RATE      Component Value Range   POCT SED RATE 12  0 - 22 (mm/hr)   CXR: [facility] reading (PRIMARY) by  Dr. Cleta Alberts. Normal chest.      Assessment & Plan:   1. Fatigue  Comprehensive metabolic panel, TSH, IFOBT POC (occult bld, rslt in office), DG Chest 2 View  2. Cough  POCT CBC, DG Chest 2 View, POCT SEDIMENTATION RATE, benzonatate (TESSALON) 100 MG capsule  3. Sorethroat  POCT CBC  4. Urinary frequency  POCT glucose (manual entry), POCT UA - Microscopic Only, POCT urinalysis dipstick  5. Nocturia  POCT urinalysis dipstick  6. Hip pain, bilateral  ibuprofen (ADVIL,MOTRIN) 800 MG tablet. Reminded patient to use this with EXTREME caution given his anticoagulation therapy for DVT.  7. Constipation    8. Screening examination for venereal disease  RPR, GC/chlamydia probe amp, urine, Hepatitis B surface antibody, Hepatitis B surface antigen, Hepatitis C antibody  9. HTN (hypertension)  POCT CBC, TSH,  Lipid panel  10. GERD (gastroesophageal reflux disease)  May be causing his respiratory symptoms (LPR). Restart Nexium and use daily.   If symptoms worsen or persist, re-evaluate here or with PCP (Dr. Knox Royalty).  Would D/C Lisinopril temporarily, as the cough may be ACE-I induced.  Discussed with Dr. Cleta Alberts.

## 2012-03-11 LAB — GC/CHLAMYDIA PROBE AMP, URINE
Chlamydia, Swab/Urine, PCR: NEGATIVE
GC Probe Amp, Urine: NEGATIVE

## 2012-03-14 ENCOUNTER — Encounter: Payer: Self-pay | Admitting: Physician Assistant

## 2012-03-23 ENCOUNTER — Telehealth: Payer: Self-pay

## 2012-03-23 NOTE — Telephone Encounter (Signed)
Chelle, have you seen these lab results?  Pls advise.

## 2012-03-23 NOTE — Telephone Encounter (Signed)
Calling for lab results. °

## 2012-03-24 ENCOUNTER — Telehealth: Payer: Self-pay

## 2012-03-24 NOTE — Telephone Encounter (Signed)
Yes.  Letter was sent and received by patient.  He called with questions that were answered appropriately by clinical staff. (See Notes tab in Chart Review). Thanks for verifying!

## 2012-03-24 NOTE — Telephone Encounter (Signed)
PT RECEIVED LETTER RE TEST RESULTS,BUT WOULD LIKE TO SPEAK WITH CLINICAL FOR MORE QUESTIONS,   PLEASE CALL [phone]

## 2012-03-24 NOTE — Telephone Encounter (Signed)
Spoke with patient and he just wanted to make sure he did not have hepatitis B and clarify that he needed to get hep B series.

## 2012-04-10 ENCOUNTER — Telehealth: Payer: Self-pay | Admitting: Cardiology

## 2012-04-10 NOTE — Telephone Encounter (Signed)
Kyle Ryan drug needs refill for pt for diltiazem, pls fax, computer and phone down

## 2012-04-11 ENCOUNTER — Other Ambulatory Visit: Payer: Self-pay | Admitting: Nurse Practitioner

## 2012-04-11 MED ORDER — DILTIAZEM HCL ER COATED BEADS 240 MG PO CP24: 240.0000 mg | ORAL_CAPSULE | Freq: Every day | ORAL | Status: DC

## 2012-04-14 ENCOUNTER — Other Ambulatory Visit: Payer: Self-pay | Admitting: Nurse Practitioner

## 2012-04-14 NOTE — Telephone Encounter (Signed)
error 

## 2012-04-25 ENCOUNTER — Other Ambulatory Visit: Payer: Self-pay | Admitting: Nurse Practitioner

## 2012-04-25 MED ORDER — DILTIAZEM HCL ER COATED BEADS 240 MG PO CP24: 240.0000 mg | ORAL_CAPSULE | Freq: Every day | ORAL | Status: DC

## 2012-05-29 ENCOUNTER — Other Ambulatory Visit: Payer: Self-pay | Admitting: Nurse Practitioner

## 2012-06-02 ENCOUNTER — Ambulatory Visit (INDEPENDENT_AMBULATORY_CARE_PROVIDER_SITE_OTHER): Payer: BC Managed Care – PPO | Admitting: Internal Medicine

## 2012-06-02 VITALS — BP 161/89 | HR 81 | Temp 99.1°F | Resp 16 | Ht 76.0 in | Wt 223.0 lb

## 2012-06-02 DIAGNOSIS — Z2089 Contact with and (suspected) exposure to other communicable diseases: Secondary | ICD-10-CM

## 2012-06-02 DIAGNOSIS — Z202 Contact with and (suspected) exposure to infections with a predominantly sexual mode of transmission: Secondary | ICD-10-CM

## 2012-06-02 LAB — POCT URINALYSIS DIPSTICK
Bilirubin, UA: NEGATIVE
Blood, UA: NEGATIVE
Glucose, UA: NEGATIVE
Ketones, UA: NEGATIVE
Leukocytes, UA: NEGATIVE
Nitrite, UA: NEGATIVE
Protein, UA: NEGATIVE
Spec Grav, UA: 1.02
Urobilinogen, UA: 1
pH, UA: 8

## 2012-06-02 LAB — POCT UA - MICROSCOPIC ONLY
Bacteria, U Microscopic: NEGATIVE
Casts, Ur, LPF, POC: NEGATIVE
Crystals, Ur, HPF, POC: NEGATIVE
Epithelial cells, urine per micros: NEGATIVE
Mucus, UA: NEGATIVE
RBC, urine, microscopic: NEGATIVE
WBC, Ur, HPF, POC: NEGATIVE
Yeast, UA: NEGATIVE

## 2012-06-02 MED ORDER — AZITHROMYCIN 1 G PO PACK: 1.0000 | PACK | Freq: Once | ORAL | Status: AC

## 2012-06-02 NOTE — Progress Notes (Signed)
  Subjective:    Patient ID: Kyle Ryan, male    DOB: 1961/05/05, 51 y.o.   MRN: [id number]  HPICame in because girlfriend reports she has trichomonas He is asymptomatic but worried They have been together for several months/no barrier protection He says no outside partners He is unaware of what else she might have been tested for  His questions about whether bodily fluids or other exposures could possibly transmit Trichomonas He apparently uses a cane now injection method for achieving erections/he does Not remember the name of this Denies IV drug use or exposure to IV drug users  Review of Systems    Patient Active Problem List  Diagnosis  . DVT (deep venous thrombosis)  . HTN (hypertension)  . A-fib    -  GERD   -  Urinary flow problems  Current outpatient prescriptions:diltiazem (CARDIZEM CD) 240 MG 24 hr capsule, TAKE 1 CAPSULE (240 MG TOTAL) BY MOUTH DAILY., Disp: 30 capsule, Rfl: 0;  esomeprazole (NEXIUM) 40 MG capsule, Take 40 mg by mouth daily before breakfast., Disp: , Rfl: ;  ibuprofen (ADVIL,MOTRIN) 800 MG tablet, Take 1 tablet (800 mg total) by mouth daily as needed. Use with EXTREME caution while taking Xarelto, Disp: 30 tablet, Rfl: 0 lisinopril (PRINIVIL,ZESTRIL) 40 MG tablet, Take 1 tablet (40 mg total) by mouth daily. Home medication.  Resume at home dose., Disp: , Rfl: ;  sildenafil (VIAGRA) 100 MG tablet, Take 100 mg by mouth daily as needed. Erectile dysfunction  , Disp: , Rfl: ;  Tamsulosin HCl (FLOMAX) 0.4 MG CAPS, Take 0.4 mg by mouth daily after supper.  , Disp: , Rfl: ;   All problems stable   Objective:   Physical Exam Filed Vitals:   06/02/12 1818  BP: 161/89  Pulse: 81  Temp: 99.1 F (37.3 C)  Resp: 16  In no acute distress but somewhat anxious Has lots of questions regarding STD transmission    Results for orders placed in visit on 06/02/12  POCT UA - MICROSCOPIC ONLY      Component Value Range   WBC, Ur, HPF, POC neg     RBC, urine,  microscopic neg     Bacteria, U Microscopic neg     Mucus, UA neg     Epithelial cells, urine per micros neg     Crystals, Ur, HPF, POC neg     Casts, Ur, LPF, POC neg     Yeast, UA neg    POCT URINALYSIS DIPSTICK      Component Value Range   Color, UA yellow     Clarity, UA clear     Glucose, UA neg     Bilirubin, UA neg     Ketones, UA neg     Spec Grav, UA 1.020     Blood, UA neg     pH, UA 8.0     Protein, UA neg     Urobilinogen, UA 1.0     Nitrite, UA neg     Leukocytes, UA Negative         Assessment & Plan:  Problem #1 exposure to STD With the allergy to Flagyl Will use Zithromax 1 g Call with results of STD screening

## 2012-06-03 LAB — HIV ANTIBODY (ROUTINE TESTING W REFLEX): HIV: NONREACTIVE

## 2012-06-04 LAB — GC/CHLAMYDIA PROBE AMP, URINE
Chlamydia, Swab/Urine, PCR: NEGATIVE
GC Probe Amp, Urine: NEGATIVE

## 2012-06-04 LAB — RPR

## 2012-06-23 ENCOUNTER — Telehealth: Payer: Self-pay | Admitting: Radiology

## 2012-06-23 NOTE — Telephone Encounter (Signed)
Patient called, he stated he did not get call about his labs. Dr Theotis Barrio has reviewed these and they are negative. Patient is advised.

## 2012-06-30 ENCOUNTER — Other Ambulatory Visit: Payer: Self-pay | Admitting: Nurse Practitioner

## 2012-07-09 ENCOUNTER — Ambulatory Visit: Payer: BC Managed Care – PPO | Admitting: Nurse Practitioner

## 2012-07-22 ENCOUNTER — Ambulatory Visit (INDEPENDENT_AMBULATORY_CARE_PROVIDER_SITE_OTHER): Payer: BC Managed Care – PPO | Admitting: Nurse Practitioner

## 2012-07-22 ENCOUNTER — Encounter: Payer: Self-pay | Admitting: Nurse Practitioner

## 2012-07-22 VITALS — BP 132/80 | HR 53 | Ht 77.0 in | Wt 227.5 lb

## 2012-07-22 DIAGNOSIS — I480 Paroxysmal atrial fibrillation: Secondary | ICD-10-CM

## 2012-07-22 DIAGNOSIS — I4891 Unspecified atrial fibrillation: Secondary | ICD-10-CM

## 2012-07-22 MED ORDER — DILTIAZEM HCL ER COATED BEADS 240 MG PO CP24: 240.0000 mg | ORAL_CAPSULE | Freq: Every day | ORAL | Status: DC

## 2012-07-22 NOTE — Patient Instructions (Addendum)
Stay on your current medicines  I have refilled your Diltiazem today to the drug store for the next year  We will see you back in a year  Call the [facility] office at [phone] if you have any questions, problems or concerns.

## 2012-07-22 NOTE — Progress Notes (Signed)
Kyle Ryan Date of Birth: [birth year] Medical Record #[id number]  History of Present Illness: Kyle Ryan is seen back today for a follow up visit. He is a former patient of Dr. Ronnald Nian. He has had PAF, ED, HTN and past tobacco/alcohol use. Has had DVT last year and is on Xarelto. No etiology determined per his report.   He comes in today. He is here alone. I have not seen him since July of 2011. He says he has been doing well. No chest pain. Not short of breath. Has had lipids checked with his PCP recently. He is on Xarelto for his left leg DVT. No adverse effects noted. No recurrent atrial fib. Needs his medicine refilled today. Not dizzy or lightheaded. No syncope reported.   Current Outpatient Prescriptions on File Prior to Visit  Medication Sig Dispense Refill  . esomeprazole (NEXIUM) 40 MG capsule Take 40 mg by mouth daily before breakfast.      . ibuprofen (ADVIL,MOTRIN) 800 MG tablet Take 1 tablet (800 mg total) by mouth daily as needed. Use with EXTREME caution while taking Xarelto  30 tablet  0  . lisinopril (PRINIVIL,ZESTRIL) 40 MG tablet Take 1 tablet (40 mg total) by mouth daily. Home medication.  Resume at home dose.      . sildenafil (VIAGRA) 100 MG tablet Take 100 mg by mouth daily as needed. Erectile dysfunction        . Tamsulosin HCl (FLOMAX) 0.4 MG CAPS Take 0.4 mg by mouth daily after supper.        Marland Kitchen DISCONTD: diltiazem (CARDIZEM CD) 240 MG 24 hr capsule TAKE 1 CAPSULE (240 MG TOTAL) BY MOUTH DAILY.  30 capsule  0    Allergies  Allergen Reactions  . Flagyl (Metronidazole Hcl) Rash    Past Medical History  Diagnosis Date  . DVT (deep venous thrombosis) 10/04/2011  . Hypertension   . PAF (paroxysmal atrial fibrillation)   . ED (erectile dysfunction)     Past Surgical History  Procedure Date  . Arthroscopy of knee     History  Smoking status  . Former Smoker  . Quit date: 07/23/2011  Smokeless tobacco  . Not on file    History  Alcohol Use  . Yes     Family History  Problem Relation Age of Onset  . Diabetes type II Mother   . Diabetes type II Sister   . Diabetes Sister     Review of Systems: The review of systems is per the HPI.  All other systems were reviewed and are negative.  Physical Exam: BP 132/80  Pulse 53  Ht 6\' 5"  (1.956 m)  Wt 227 lb 8 oz (103.193 kg)  BMI 26.98 kg/m2 Patient is very pleasant and in no acute distress. Skin is warm and dry. Color is normal.  HEENT is unremarkable. Normocephalic/atraumatic. PERRL. Sclera are nonicteric. Neck is supple. No masses. No JVD. Lungs are clear. Cardiac exam shows a regular rate and rhythm. Abdomen is soft. Extremities are without edema. Gait and ROM are intact. No gross neurologic deficits noted.   LABORATORY DATA:  Lab Results  Component Value Date   WBC 3.4* 03/08/2012   HGB 15.3 03/08/2012   HCT 43.5 03/08/2012   PLT 132* 10/05/2011   GLUCOSE 97 03/08/2012   CHOL 181 03/08/2012   TRIG 73 03/08/2012   HDL 60 03/08/2012   LDLCALC 562* 03/08/2012   ALT 24 03/08/2012   AST 31 03/08/2012   NA 141 03/08/2012   K  4.4 03/08/2012   CL 107 03/08/2012   CREATININE 1.17 03/08/2012   BUN 14 03/08/2012   CO2 28 03/08/2012   TSH 1.071 03/08/2012   INR 1.18 10/05/2011     Assessment / Plan: 1. PAF - EKG today shows sinus bradycardia. Diltiazem is refilled today for him.   2. DVT - on Xarelto.  3. HTN - blood pressure looks ok.   I think overall he is doing well. I have refilled his medicine today. He has had recent labs. Will see him back in one year. Patient is agreeable to this plan and will call if any problems develop in the interim.

## 2012-07-25 ENCOUNTER — Encounter: Payer: Self-pay | Admitting: *Deleted

## 2012-07-31 ENCOUNTER — Other Ambulatory Visit: Payer: Self-pay | Admitting: Nurse Practitioner

## 2012-08-19 ENCOUNTER — Ambulatory Visit (INDEPENDENT_AMBULATORY_CARE_PROVIDER_SITE_OTHER): Payer: BC Managed Care – PPO | Admitting: Family Medicine

## 2012-08-19 VITALS — BP 124/88 | HR 73 | Temp 98.5°F | Resp 16 | Ht 76.0 in | Wt 222.4 lb

## 2012-08-19 DIAGNOSIS — L0291 Cutaneous abscess, unspecified: Secondary | ICD-10-CM

## 2012-08-19 DIAGNOSIS — R109 Unspecified abdominal pain: Secondary | ICD-10-CM

## 2012-08-19 DIAGNOSIS — Z125 Encounter for screening for malignant neoplasm of prostate: Secondary | ICD-10-CM | POA: Insufficient documentation

## 2012-08-19 LAB — POCT CBC
Granulocyte percent: 64.5 %G (ref 37–80)
HCT, POC: 46.8 % (ref 43.5–53.7)
Hemoglobin: 14.6 g/dL (ref 14.1–18.1)
Lymph, poc: 1.5 (ref 0.6–3.4)
MCH, POC: 30.9 pg (ref 27–31.2)
MCHC: 31.2 g/dL — AB (ref 31.8–35.4)
MCV: 98.9 fL — AB (ref 80–97)
MID (cbc): 0.4 (ref 0–0.9)
MPV: 9 fL (ref 0–99.8)
POC Granulocyte: 3.4 (ref 2–6.9)
POC LYMPH PERCENT: 28.1 % (ref 10–50)
POC MID %: 7.4 % (ref 0–12)
Platelet Count, POC: 175 10*3/uL (ref 142–424)
RBC: 4.73 M/uL (ref 4.69–6.13)
RDW, POC: 14.3 %
WBC: 5.3 10*3/uL (ref 4.6–10.2)

## 2012-08-19 NOTE — Patient Instructions (Addendum)
Thank you for coming in today. Please continue warm compress on the abscess in your groin If your abdominal sensations continue please come back or see your gastroenterologist Try Colace twice a day We will call you if your PSA is abnormal.

## 2012-08-19 NOTE — Progress Notes (Signed)
Kyle Ryan is a 51 y.o. male who presents to [facility] today for  1) abscess: Patient developed an abscess in the left groin several days ago. It spontaneously ruptured and started draining yesterday. His pain has resolved she feels much better. He wonders if there's anything he should do. He denies any fevers or chills or weight loss.  He feels well otherwise  2) upper left quadrant abdominal discomfort: Present for about one month. Patient has intermittent sensations with swallowing. He additionally notes a mild heartburn for which he takes over-the-counter antacid medications. He feels well otherwise and wonders if there's anything else going on. He denies any blood in the stool nausea vomiting diarrhea. He does note mild constipation. He feels well. Patient would like blood work to assess kidney and liver function today. He notes that he has had a colonoscopy and upper endoscopy with the last few years. He thinks they were normal. These are done in [place]  3) prostate cancer screening:  Patient would like prostate cancer screening today if possible. He is not sure if he's ever had a PSA test.  He denies any family history of prostate cancer.   PMH: Reviewed significant for DVT and atrial fibrillation on Xarelto History  Substance Use Topics  . Smoking status: Former Smoker    Quit date: 07/23/2011  . Smokeless tobacco: Not on file  . Alcohol Use: Yes   ROS as above  Medications reviewed. Current Outpatient Prescriptions  Medication Sig Dispense Refill  . ALPRAZolam (XANAX) 1 MG tablet Take 1 mg by mouth at bedtime as needed.      Marland Kitchen aspirin 81 MG tablet Take 81 mg by mouth daily.      Marland Kitchen diltiazem (CARDIZEM CD) 240 MG 24 hr capsule Take 1 capsule (240 mg total) by mouth daily.  30 capsule  11  . esomeprazole (NEXIUM) 40 MG capsule Take 40 mg by mouth daily before breakfast.      . ibuprofen (ADVIL,MOTRIN) 800 MG tablet Take 1 tablet (800 mg total) by mouth daily as needed. Use with  EXTREME caution while taking Xarelto  30 tablet  0  . lisinopril (PRINIVIL,ZESTRIL) 40 MG tablet Take 1 tablet (40 mg total) by mouth daily. Home medication.  Resume at home dose.      . Rivaroxaban (XARELTO) 20 MG TABS Take 20 mg by mouth daily.      . sildenafil (VIAGRA) 100 MG tablet Take 100 mg by mouth daily as needed. Erectile dysfunction        . Tamsulosin HCl (FLOMAX) 0.4 MG CAPS Take 0.4 mg by mouth daily after supper.        . diltiazem (CARDIZEM CD) 240 MG 24 hr capsule TAKE 1 CAPSULE (240 MG TOTAL) BY MOUTH DAILY.  30 capsule  0    Exam:  BP 124/88  Pulse 73  Temp 98.5 F (36.9 C) (Oral)  Resp 16  Ht 6\' 4"  (1.93 m)  Wt 222 lb 6.4 oz (100.88 kg)  BMI 27.07 kg/m2  SpO2 98% Gen: Well NAD HEENT: EOMI,  MMM Lungs: CTABL Nl WOB Heart: RRR no MRG Abd: NABS, NT, ND Exts: Non edematous BL  LE, warm and well perfused.  Skin: Left groin. Well-drained abscess without any stranding erythema or tenderness in the left groin.  Results for orders placed in visit on 08/19/12 (from the past 72 hour(s))  POCT CBC     Status: Abnormal   Collection Time   08/19/12  5:46 PM  Component Value Range Comment   WBC 5.3  4.6 - 10.2 K/uL    Lymph, poc 1.5  0.6 - 3.4    POC LYMPH PERCENT 28.1  10 - 50 %L    MID (cbc) 0.4  0 - 0.9    POC MID % 7.4  0 - 12 %M    POC Granulocyte 3.4  2 - 6.9    Granulocyte percent 64.5  37 - 80 %G    RBC 4.73  4.69 - 6.13 M/uL    Hemoglobin 14.6  14.1 - 18.1 g/dL    HCT, POC 16.1  09.6 - 53.7 %    MCV 98.9 (*) 80 - 97 fL    MCH, POC 30.9  27 - 31.2 pg    MCHC 31.2 (*) 31.8 - 35.4 g/dL    RDW, POC 04.5      Platelet Count, POC 175  142 - 424 K/uL    MPV 9.0  0 - 99.8 fL     Assessment and Plan: 51 y.o. male with  1) drained abscess: No further treatment is warranted at this time. Recommended warm compress and routine skin care with Vaseline or antibiotic ointment. Followup as needed  2) abdominal discomfort: Likely not series etiology. Recommended  over-the-counter Colace for constipation. If not improving would recommend followup with GI for possible EGD. I feel with this complaint it is reasonable to obtain a CBC and comprehensive metabolic panel.  3) prostate cancer screening: Discussed risks and benefits of prostate cancer screening in an asymptomatic individual. Discussed that the [place] preventive services task force recommends against prostate cancer.  However I again discussed that the data is equivocal and individual risk factors should be taken into account.  He is African American therefore mildly at higher risk. I feel that it is warranted.

## 2012-08-20 LAB — COMPLETE METABOLIC PANEL WITH GFR
ALT: 25 U/L (ref 0–53)
AST: 34 U/L (ref 0–37)
Albumin: 4.6 g/dL (ref 3.5–5.2)
Alkaline Phosphatase: 53 U/L (ref 39–117)
BUN: 15 mg/dL (ref 6–23)
CO2: 25 mEq/L (ref 19–32)
Calcium: 9.4 mg/dL (ref 8.4–10.5)
Chloride: 110 mEq/L (ref 96–112)
Creat: 1.17 mg/dL (ref 0.50–1.35)
GFR, Est African American: 82 mL/min
GFR, Est Non African American: 71 mL/min
Glucose, Bld: 80 mg/dL (ref 70–99)
Potassium: 4 mEq/L (ref 3.5–5.3)
Sodium: 144 meq/L (ref 135–145)
Total Bilirubin: 0.6 mg/dL (ref 0.3–1.2)
Total Protein: 7.4 g/dL (ref 6.0–8.3)

## 2012-08-20 LAB — PSA: PSA: 0.75 ng/mL (ref <=4.00)

## 2012-08-29 ENCOUNTER — Ambulatory Visit: Payer: BC Managed Care – PPO | Admitting: Nurse Practitioner

## 2012-09-27 ENCOUNTER — Ambulatory Visit (INDEPENDENT_AMBULATORY_CARE_PROVIDER_SITE_OTHER): Payer: BC Managed Care – PPO | Admitting: Family Medicine

## 2012-09-27 VITALS — BP 138/83 | HR 67 | Temp 97.5°F | Resp 18 | Ht 77.5 in | Wt 226.0 lb

## 2012-09-27 DIAGNOSIS — I10 Essential (primary) hypertension: Secondary | ICD-10-CM

## 2012-09-27 DIAGNOSIS — J309 Allergic rhinitis, unspecified: Secondary | ICD-10-CM

## 2012-09-27 MED ORDER — AZELASTINE HCL 0.1 % NA SOLN: 1.0000 | Freq: Two times a day (BID) | NASAL | Status: DC

## 2012-09-27 MED ORDER — CETIRIZINE HCL 10 MG PO TABS: 10.0000 mg | ORAL_TABLET | Freq: Every day | ORAL | Status: DC

## 2012-09-27 MED ORDER — LISINOPRIL 40 MG PO TABS: 40.0000 mg | ORAL_TABLET | Freq: Every day | ORAL | Status: DC

## 2012-09-27 MED ORDER — FLUTICASONE PROPIONATE 50 MCG/ACT NA SUSP: 2.0000 | Freq: Every day | NASAL | Status: DC

## 2012-09-27 NOTE — Progress Notes (Signed)
Subjective:    Patient ID: Kyle Ryan, male    DOB: 05-Feb-1961, 51 y.o.   MRN: [id number]  HPI  Season allergies with nasal congestion x 3 wks with HAs.  No f/c. Sxs recurrent. No pharyngitis, no cough, no sinus pressure, no teeth pain, no watery itchy eyes, no ear pain. Did some otc nasal spray which helped until it ran out.  Past Medical History  Diagnosis Date  . DVT (deep venous thrombosis) 10/04/2011    on Xarelto  . Hypertension   . PAF (paroxysmal atrial fibrillation)   . ED (erectile dysfunction)    Current Outpatient Prescriptions on File Prior to Visit  Medication Sig Dispense Refill  . ALPRAZolam (XANAX) 1 MG tablet Take 1 mg by mouth at bedtime as needed.      Marland Kitchen aspirin 81 MG tablet Take 81 mg by mouth daily.      Marland Kitchen diltiazem (CARDIZEM CD) 240 MG 24 hr capsule Take 1 capsule (240 mg total) by mouth daily.  30 capsule  11  . diltiazem (CARDIZEM CD) 240 MG 24 hr capsule TAKE 1 CAPSULE (240 MG TOTAL) BY MOUTH DAILY.  30 capsule  0  . esomeprazole (NEXIUM) 40 MG capsule Take 40 mg by mouth daily before breakfast.      . ibuprofen (ADVIL,MOTRIN) 800 MG tablet Take 1 tablet (800 mg total) by mouth daily as needed. Use with EXTREME caution while taking Xarelto  30 tablet  0  . sildenafil (VIAGRA) 100 MG tablet Take 100 mg by mouth daily as needed. Erectile dysfunction        . Tamsulosin HCl (FLOMAX) 0.4 MG CAPS Take 0.4 mg by mouth daily after supper.        . [DISCONTINUED] lisinopril (PRINIVIL,ZESTRIL) 40 MG tablet Take 1 tablet (40 mg total) by mouth daily. Home medication.  Resume at home dose.                        . Rivaroxaban (XARELTO) 20 MG TABS Take 20 mg by mouth daily.          Review of Systems  Constitutional: Negative for fever, chills, diaphoresis, activity change, appetite change, fatigue and unexpected weight change.  HENT: Positive for congestion, rhinorrhea and postnasal drip. Negative for ear pain, sore throat, sneezing, mouth sores, neck pain, neck  stiffness, dental problem and sinus pressure.   Respiratory: Negative for cough and shortness of breath.   Cardiovascular: Negative for chest pain.  Gastrointestinal: Negative for nausea, vomiting, abdominal pain, diarrhea and constipation.  Genitourinary: Negative for dysuria.  Musculoskeletal: Negative for myalgias and arthralgias.  Skin: Negative for rash.  Neurological: Positive for headaches. Negative for syncope.  Hematological: Negative for adenopathy.  Psychiatric/Behavioral: Negative for sleep disturbance.      BP 138/83  Pulse 67  Temp 97.5 F (36.4 C) (Oral)  Resp 18  Ht 6' 5.5" (1.969 m)  Wt 226 lb (102.513 kg)  BMI 26.46 kg/m2  SpO2 99% Objective:   Physical Exam  Constitutional: He is oriented to person, place, and time. He appears well-developed and well-nourished. No distress.  HENT:  Head: Normocephalic and atraumatic.  Right Ear: Tympanic membrane, external ear and ear canal normal.  Left Ear: Tympanic membrane, external ear and ear canal normal.  Nose: Mucosal edema and rhinorrhea present. Right sinus exhibits no maxillary sinus tenderness. Left sinus exhibits no maxillary sinus tenderness.  Mouth/Throat: Uvula is midline and mucous membranes are normal. Posterior oropharyngeal edema and  posterior oropharyngeal erythema present. No oropharyngeal exudate.  Eyes: Conjunctivae normal are normal. No scleral icterus.  Neck: Normal range of motion. Neck supple. No thyromegaly present.  Cardiovascular: Normal rate, regular rhythm, normal heart sounds and intact distal pulses.   Pulmonary/Chest: Effort normal and breath sounds normal. No respiratory distress.  Musculoskeletal: He exhibits no edema.  Lymphadenopathy:    He has no cervical adenopathy.  Neurological: He is alert and oriented to person, place, and time.  Skin: Skin is warm and dry. He is not diaphoretic. No erythema.  Psychiatric: He has a normal mood and affect. His behavior is normal.            Assessment & Plan:   1. HTN (hypertension)  lisinopril (PRINIVIL,ZESTRIL) 40 MG  - refilled med x 6 os. Bmp nml last mo.  2. Allergic rhinitis  fluticasone (FLONASE) 50 MCG/ACT nasal spray, cetirizine (ZYRTEC) 10 MG tablet, azelastine (ASTELIN) 137 MCG/SPRAY nasal spray

## 2012-09-30 ENCOUNTER — Telehealth: Payer: Self-pay

## 2012-09-30 ENCOUNTER — Telehealth: Payer: Self-pay | Admitting: Radiology

## 2012-09-30 DIAGNOSIS — I10 Essential (primary) hypertension: Secondary | ICD-10-CM

## 2012-09-30 MED ORDER — LISINOPRIL 40 MG PO TABS: 40.0000 mg | ORAL_TABLET | Freq: Every day | ORAL | Status: DC

## 2012-09-30 NOTE — Telephone Encounter (Signed)
Resubmitted it.

## 2012-09-30 NOTE — Telephone Encounter (Signed)
Patient states lisinopril was not sent to pharmacy. Per Epic, is does look like it was sent electronically but pharmacy has no record of it. 3 other meds were sent and they have all of those. [facility] [phone]

## 2012-09-30 NOTE — Telephone Encounter (Signed)
pts rx called directly to pharmacy. They have not gotten our ERx

## 2013-02-04 ENCOUNTER — Ambulatory Visit (INDEPENDENT_AMBULATORY_CARE_PROVIDER_SITE_OTHER): Payer: BC Managed Care – PPO | Admitting: Physician Assistant

## 2013-02-04 VITALS — BP 170/98 | HR 70 | Temp 98.9°F | Resp 16 | Ht 77.0 in | Wt 228.0 lb

## 2013-02-04 DIAGNOSIS — Z7251 High risk heterosexual behavior: Secondary | ICD-10-CM

## 2013-02-04 LAB — POCT URINALYSIS DIPSTICK
Bilirubin, UA: NEGATIVE
Blood, UA: NEGATIVE
Glucose, UA: NEGATIVE
Ketones, UA: NEGATIVE
Leukocytes, UA: NEGATIVE
Nitrite, UA: NEGATIVE
Protein, UA: NEGATIVE
Spec Grav, UA: 1.025
Urobilinogen, UA: 0.2
pH, UA: 6

## 2013-02-04 LAB — POCT UA - MICROSCOPIC ONLY
Casts, Ur, LPF, POC: NEGATIVE
Crystals, Ur, HPF, POC: NEGATIVE
Epithelial cells, urine per micros: NEGATIVE
RBC, urine, microscopic: NEGATIVE
Yeast, UA: NEGATIVE

## 2013-02-04 MED ORDER — CEFTRIAXONE SODIUM 1 G IJ SOLR: 250.0000 mg | INTRAMUSCULAR | Status: DC

## 2013-02-04 MED ORDER — AZITHROMYCIN 500 MG PO TABS: 500.0000 mg | ORAL_TABLET | Freq: Every day | ORAL | Status: DC

## 2013-02-04 MED ORDER — CEFTRIAXONE SODIUM 1 G IJ SOLR
250.0000 mg | Freq: Once | INTRAMUSCULAR | Status: AC
  Administered 2013-02-04: 250 mg via INTRAMUSCULAR

## 2013-02-04 NOTE — Progress Notes (Signed)
Patient ID: Kyle Ryan MRN: [id number], DOB: [birth year], 52 y.o. Date of Encounter: 02/04/2013, 3:55 PM  Primary Physician: Paulino Rily, MD  Chief Complaint: STD check  HPI: 52 y.o. male with history below presents for STD check. Patient was having intercourse with girlfriend 4 days ago. Condom fell off. After intercourse she informed him that she had trichomonas. He presents today anxious about this stating that he wants to be tested for syphilis, HIV, gonorrhea, and chlamydia. He also requests treatment for GC, chlamydia, and trichomonas. No new partners. He is asymptomatic, but worried.      Past Medical History  Diagnosis Date  . DVT (deep venous thrombosis) 10/04/2011    on Xarelto  . Hypertension   . PAF (paroxysmal atrial fibrillation)   . ED (erectile dysfunction)      Home Meds: Prior to Admission medications   Medication Sig Start Date End Date Taking? Authorizing Provider  ALPRAZolam Prudy Feeler) 1 MG tablet Take 1 mg by mouth at bedtime as needed.   Yes Historical Provider, MD  aspirin 81 MG tablet Take 81 mg by mouth daily.   Yes Historical Provider, MD  azelastine (ASTELIN) 137 MCG/SPRAY nasal spray Place 1 spray into the nose 2 (two) times daily. Use in each nostril as directed 09/27/12  Yes Sherren Mocha, MD  diltiazem (CARDIZEM CD) 240 MG 24 hr capsule Take 1 capsule (240 mg total) by mouth daily. 07/22/12  Yes Rosalio Macadamia, NP  esomeprazole (NEXIUM) 40 MG capsule Take 40 mg by mouth daily before breakfast.   Yes Historical Provider, MD  ibuprofen (ADVIL,MOTRIN) 800 MG tablet Take 1 tablet (800 mg total) by mouth daily as needed. Use with EXTREME caution while taking Xarelto 03/08/12  Yes Chelle S Jeffery, PA-C  lisinopril (PRINIVIL,ZESTRIL) 40 MG tablet Take 1 tablet (40 mg total) by mouth daily. Home medication.  Resume at home dose. 09/30/12 09/30/13 Yes Sherren Mocha, MD  predniSONE (STERAPRED UNI-PAK) 5 MG TABS Take by mouth.   Yes Historical Provider, MD  Rivaroxaban  (XARELTO) 20 MG TABS Take 20 mg by mouth daily.   Yes Historical Provider, MD  sildenafil (VIAGRA) 100 MG tablet Take 100 mg by mouth daily as needed. Erectile dysfunction     Yes Historical Provider, MD  Tamsulosin HCl (FLOMAX) 0.4 MG CAPS Take 0.4 mg by mouth daily after supper.     Yes Historical Provider, MD  cetirizine (ZYRTEC) 10 MG tablet Take 1 tablet (10 mg total) by mouth daily. 09/27/12   Sherren Mocha, MD  diltiazem (CARDIZEM CD) 240 MG 24 hr capsule TAKE 1 CAPSULE (240 MG TOTAL) BY MOUTH DAILY. 07/31/12   Rosalio Macadamia, NP  fluticasone (FLONASE) 50 MCG/ACT nasal spray Place 2 sprays into the nose at bedtime. 09/27/12   Sherren Mocha, MD    Allergies:  Allergies  Allergen Reactions  . Flagyl (Metronidazole Hcl) Rash    History   Social History  . Marital Status: Single    Spouse Name: N/A    Number of Children: N/A  . Years of Education: N/A   Occupational History  . Not on file.   Social History Main Topics  . Smoking status: Former Smoker    Quit date: 07/23/2011  . Smokeless tobacco: Not on file  . Alcohol Use: Yes  . Drug Use: Yes    Special: Marijuana  . Sexually Active: Yes   Other Topics Concern  . Not on file   Social History Narrative  .  No narrative on file     Review of Systems: Constitutional: negative for chills, fever, night sweats, weight changes, or fatigue  Cardiovascular: negative for chest pain or palpitations Respiratory: negative for hemoptysis, wheezing, shortness of breath, or cough Abdominal: negative for abdominal pain, nausea, vomiting, or diarrhea Genitourinary: see above Dermatological: negative for rash   Physical Exam: Blood pressure 170/98, pulse 70, temperature 98.9 F (37.2 C), temperature source Oral, resp. rate 16, height 6\' 5"  (1.956 m), weight 228 lb (103.42 kg), SpO2 98.00%., Body mass index is 27.03 kg/(m^2). General: Well developed, well nourished, in no acute distress. Head: Normocephalic, atraumatic, eyes without  discharge, sclera non-icteric, nares are without discharge.   Neck: Supple. No thyromegaly. Full ROM. No lymphadenopathy. Lungs: Clear bilaterally to auscultation without wheezes, rales, or rhonchi. Breathing is unlabored. Heart: RRR with S1 S2. No murmurs, rubs, or gallops appreciated. Msk:  Strength and tone normal for age. Extremities/Skin: Warm and dry. No clubbing or cyanosis. No edema. No rashes or suspicious lesions. Neuro: Alert and oriented X 3. Moves all extremities spontaneously. Gait is normal. CNII-XII grossly in tact. Psych:  Responds to questions appropriately with a normal affect.   Labs: Results for orders placed in visit on 02/04/13  POCT UA - MICROSCOPIC ONLY      Result Value Range   WBC, Ur, HPF, POC 0-3     RBC, urine, microscopic neg     Bacteria, U Microscopic 2+     Mucus, UA mod     Epithelial cells, urine per micros neg     Crystals, Ur, HPF, POC neg     Casts, Ur, LPF, POC neg     Yeast, UA neg    POCT URINALYSIS DIPSTICK      Result Value Range   Color, UA yellow     Clarity, UA clear     Glucose, UA neg     Bilirubin, UA neg     Ketones, UA neg     Spec Grav, UA 1.025     Blood, UA neg     pH, UA 6.0     Protein, UA neg     Urobilinogen, UA 0.2     Nitrite, UA neg     Leukocytes, UA Negative      HIV, RPR, and uriprobe all pending. Declines HSV testing.   ASSESSMENT AND PLAN:  52 y.o. male with STD exposure and HTN. 1) STD exposure -Rocephin 250 mg IM in office, patient requested  -Azithromycin 500 mg 2 po now #2 no RF, patient requested  -Safe sex practices -Await above labs -Advised patient to RTC in approximately 6-8 weeks for recheck of labs for possibility of seroconversion   2) HTN -Patient is anxious regarding the above -Blood pressure previously well controlled -Will have patient check BP at home, if remains elevated he will let us know    Signed, Eula Listen, PA-C 02/04/2013 3:55 PM

## 2013-02-05 LAB — GC/CHLAMYDIA PROBE AMP, URINE
Chlamydia, Swab/Urine, PCR: NEGATIVE
GC Probe Amp, Urine: NEGATIVE

## 2013-02-05 LAB — HIV ANTIBODY (ROUTINE TESTING W REFLEX): HIV: NONREACTIVE

## 2013-02-05 LAB — RPR

## 2013-03-19 ENCOUNTER — Ambulatory Visit (INDEPENDENT_AMBULATORY_CARE_PROVIDER_SITE_OTHER): Payer: BC Managed Care – PPO | Admitting: *Deleted

## 2013-03-19 DIAGNOSIS — I82409 Acute embolism and thrombosis of unspecified deep veins of unspecified lower extremity: Secondary | ICD-10-CM

## 2013-03-19 DIAGNOSIS — R600 Localized edema: Secondary | ICD-10-CM

## 2013-04-02 ENCOUNTER — Other Ambulatory Visit: Payer: Self-pay | Admitting: Internal Medicine

## 2013-04-03 ENCOUNTER — Telehealth: Payer: Self-pay | Admitting: Internal Medicine

## 2013-04-03 ENCOUNTER — Other Ambulatory Visit: Payer: Self-pay | Admitting: *Deleted

## 2013-04-03 MED ORDER — RIVAROXABAN 20 MG PO TABS: 20.0000 mg | ORAL_TABLET | Freq: Every day | ORAL | Status: DC

## 2013-04-03 NOTE — Telephone Encounter (Signed)
Pt states he still have not received his blood thinner-Follow proper procedure and still have no  Medicine-Please take care of this today-he will be out of his medicine this week-end!Please call when you have taken care of this-phone#[phone]!

## 2013-04-03 NOTE — Telephone Encounter (Signed)
Send prescription to  pharmacy

## 2013-04-20 ENCOUNTER — Encounter: Payer: Self-pay | Admitting: Internal Medicine

## 2013-04-21 ENCOUNTER — Encounter: Payer: Self-pay | Admitting: Internal Medicine

## 2013-04-21 ENCOUNTER — Ambulatory Visit (INDEPENDENT_AMBULATORY_CARE_PROVIDER_SITE_OTHER): Payer: BC Managed Care – PPO | Admitting: Internal Medicine

## 2013-04-21 VITALS — BP 116/74 | HR 70 | Ht 76.5 in | Wt 226.9 lb

## 2013-04-21 DIAGNOSIS — I4891 Unspecified atrial fibrillation: Secondary | ICD-10-CM

## 2013-04-21 DIAGNOSIS — I10 Essential (primary) hypertension: Secondary | ICD-10-CM

## 2013-04-21 DIAGNOSIS — I82409 Acute embolism and thrombosis of unspecified deep veins of unspecified lower extremity: Secondary | ICD-10-CM

## 2013-04-21 DIAGNOSIS — I480 Paroxysmal atrial fibrillation: Secondary | ICD-10-CM

## 2013-04-21 DIAGNOSIS — I82401 Acute embolism and thrombosis of unspecified deep veins of right lower extremity: Secondary | ICD-10-CM

## 2013-04-21 NOTE — Progress Notes (Signed)
OFFICE NOTE  Chief Complaint:  Followup DVT  Primary Care Physician: Paulino Rily, MD  HPI:  Kyle Ryan is a 52 year old gentleman who I have been following for atrial fibrillation, which he has had for about 2 years paroxysmally. He recently developed DVT, was placed on Xarelto, and was set up for cardioversion; however, spontaneously converted to sinus and never had that. He was at risk for post-thrombotic syndrome and I recommended wearing a lower extremity compression stocking, which has helped with his swelling, and he continues to wear that today. Unfortunately, repeat Dopplers on January 07, 2012, showed persistent DVT in the femoral and popliteal veins with recanalization noted at the mid femoral level, consistent with a chronic DVT. At this point, we discussed staying on the Xarelto versus coming off to go on aspirin. He is on aspirin as well, and for now I would recommend staying on that for at least 6 months and perhaps consider repeating Dopplers at that time. Recently he underwent colonoscopy and required bridging therapy but did well with that. There were no bleeding complications. He reports continued swelling without the use of a stocking, suggestive of post-thrombotic syndrome. Incidentally, he was noted to be in atrial fibrillation today, however he is unaware of this. He has had this intermittently, to my knowledge, since 2010.  PMHx:  Past Medical History  Diagnosis Date  . DVT (deep venous thrombosis) 10/04/2011    on Xarelto  . Hypertension   . PAF (paroxysmal atrial fibrillation)   . ED (erectile dysfunction)   . Limb pain     LEA VENOUS DUPLEX, 12/31/2011 - positive DVT in femoral and popliteal veins    Past Surgical History  Procedure Laterality Date  . Arthroscopy of knee      FAMHx:  Family History  Problem Relation Age of Onset  . Diabetes Mother     Type II  . Hypertension Mother   . Hypertension Brother   . Hypertension Sister   . Diabetes Sister       Type II    SOCHx:   reports that he quit smoking about 20 months ago. He does not have any smokeless tobacco history on file. He reports that  drinks alcohol. He reports that he uses illicit drugs (Marijuana).  ALLERGIES:  Allergies  Allergen Reactions  . Flagyl (Metronidazole Hcl) Rash    ROS: A comprehensive review of systems was negative except for: Cardiovascular: positive for irregular heart beat and Edema  HOME MEDS: Current Outpatient Prescriptions  Medication Sig Dispense Refill  . ALPRAZolam (XANAX) 1 MG tablet Take 1 mg by mouth at bedtime as needed.      Marland Kitchen aspirin 81 MG tablet Take 81 mg by mouth daily.      Marland Kitchen diltiazem (CARDIZEM CD) 240 MG 24 hr capsule Take 1 capsule (240 mg total) by mouth daily.  30 capsule  11  . esomeprazole (NEXIUM) 40 MG capsule Take 40 mg by mouth daily before breakfast.      . ibuprofen (ADVIL,MOTRIN) 800 MG tablet Take 1 tablet (800 mg total) by mouth daily as needed. Use with EXTREME caution while taking Xarelto  30 tablet  0  . lisinopril (PRINIVIL,ZESTRIL) 40 MG tablet Take 1 tablet (40 mg total) by mouth daily. Home medication.  Resume at home dose.  30 tablet  5  . Rivaroxaban (XARELTO) 20 MG TABS Take 1 tablet (20 mg total) by mouth daily.  30 tablet  6  . sildenafil (VIAGRA) 100 MG tablet Take  100 mg by mouth daily as needed. Erectile dysfunction        . Tamsulosin HCl (FLOMAX) 0.4 MG CAPS Take 0.4 mg by mouth daily after supper.         No current facility-administered medications for this visit.    LABS/IMAGING: No results found for this or any previous visit (from the past 48 hour(s)). No results found.  VITALS: BP 116/74  Pulse 70  Ht 6' 4.5" (1.943 m)  Wt 226 lb 14.4 oz (102.921 kg)  BMI 27.26 kg/m2  EXAM: General appearance: alert and no distress Neck: no adenopathy, no carotid bruit, no JVD, supple, symmetrical, trachea midline and thyroid not enlarged, symmetric, no tenderness/mass/nodules Lungs: clear to  auscultation bilaterally Heart: irregularly irregular rhythm Abdomen: soft, non-tender; bowel sounds normal; no masses,  no organomegaly Extremities: edema Right lower extremity swelling, stocking in place Pulses: 2+ and symmetric Skin: Skin color, texture, turgor normal. No rashes or lesions Neurologic: Grossly normal  EKG: Atrial fibrillation with controlled ventricular response at 70  ASSESSMENT: 1. Recurrent paroxysmal atrial fibrillation 2. Right DVT, unresolved 3. 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   [name] continues to have swelling of the right lower extremity. I wonder if his DVT has yet to resolve. We will go ahead and obtain a venous insufficiency study to look at both DVT and for superficial vein insufficiency. He may have both or one of the other, but the ultrasound will help with treatment. I would recommend he continue on Xarelto given risk factors of unresolved DVT and atrial fibrillation. Interestingly, he is in atrial fibrillation today but completely unaware of it. We've had this issue before and was scheduled for cardioversion however he converted prior to that. We may need to recheck an EKG at his next visit. If he is still in atrial fibrillation, he may need to be scheduled for a plain cardioversion. Subsequent to that, we might need to address antiarrhythmic therapy, however this is difficult to recommend since he is asymptomatic.  Chrystie Nose, MD, [facility] Attending Cardiologist [facility]  Sandy Haye C 04/21/2013, 4:20 PM

## 2013-04-21 NOTE — Patient Instructions (Addendum)
Your physician has requested that you have a lower extremity venous duplex. This test is an ultrasound of the veins in the legs. It looks at venous blood flow that carries blood from the heart to the legs. Allow one hour for a Lower Venous exam. There are no restrictions or special instructions. R leg only.  Please schedule in next 1-2 weeks.   Your physician recommends that you schedule a follow-up appointment in: 2-3 weeks, after venous ultrasound.

## 2013-04-22 ENCOUNTER — Telehealth: Payer: Self-pay | Admitting: Internal Medicine

## 2013-04-22 NOTE — Telephone Encounter (Signed)
Returned call.  Left message to call back before 4pm.  Message forwarded to Dr. Rennis Golden.  Paper chart# [id number] on cart.

## 2013-04-22 NOTE — Telephone Encounter (Signed)
Need a note to be excused from jury duty on 05-25-13 because of his condition!

## 2013-04-22 NOTE — Telephone Encounter (Signed)
Returning your call. °

## 2013-04-22 NOTE — Telephone Encounter (Signed)
Returned call.  Pt stated he was seen yesterday and meant to get a letter from him.  Stated he has an irregular heart beat and a blood clot and can't sit down there all day.  Pt informed Dr. Rennis Golden will be notified.  Pt verbalized understanding and agreed w/ plan.

## 2013-04-24 NOTE — Telephone Encounter (Signed)
Returned call and informed pt per instructions by MD/PA.  Pt verbalized understanding.

## 2013-04-24 NOTE — Telephone Encounter (Signed)
As long as he is taking the blood thinner, there is no contraindication to sit in jury duty. Remind him that it his civic responsibility. I cannot provide a letter to the contrary.  Thanks.  Dr. Rennis Golden

## 2013-04-27 ENCOUNTER — Ambulatory Visit (HOSPITAL_COMMUNITY)
Admission: RE | Admit: 2013-04-27 | Discharge: 2013-04-27 | Disposition: A | Discharge status: Home / Self Care | Payer: BC Managed Care – PPO | Source: Ambulatory Visit | Attending: Cardiovascular Disease | Admitting: Cardiovascular Disease

## 2013-04-27 DIAGNOSIS — I82409 Acute embolism and thrombosis of unspecified deep veins of unspecified lower extremity: Secondary | ICD-10-CM | POA: Insufficient documentation

## 2013-04-27 DIAGNOSIS — I82401 Acute embolism and thrombosis of unspecified deep veins of right lower extremity: Secondary | ICD-10-CM

## 2013-04-27 NOTE — Progress Notes (Signed)
Right Lower Extremity Venous Duplex Completed. °Kyle Ryan ° °

## 2013-05-20 ENCOUNTER — Ambulatory Visit: Payer: BC Managed Care – PPO | Admitting: Internal Medicine

## 2013-06-01 ENCOUNTER — Ambulatory Visit: Payer: BC Managed Care – PPO | Admitting: Internal Medicine

## 2013-06-19 ENCOUNTER — Ambulatory Visit: Payer: BC Managed Care – PPO | Admitting: Internal Medicine

## 2013-07-05 ENCOUNTER — Ambulatory Visit: Payer: BC Managed Care – PPO | Admitting: Family Medicine

## 2013-07-05 VITALS — BP 134/78 | HR 69 | Temp 98.0°F | Resp 17 | Ht 76.5 in | Wt 228.0 lb

## 2013-07-05 DIAGNOSIS — A64 Unspecified sexually transmitted disease: Secondary | ICD-10-CM

## 2013-07-05 DIAGNOSIS — R300 Dysuria: Secondary | ICD-10-CM

## 2013-07-05 LAB — POCT UA - MICROSCOPIC ONLY
Casts, Ur, LPF, POC: NEGATIVE
Crystals, Ur, HPF, POC: NEGATIVE
RBC, urine, microscopic: NEGATIVE
WBC, Ur, HPF, POC: NEGATIVE
Yeast, UA: NEGATIVE

## 2013-07-05 LAB — POCT URINALYSIS DIPSTICK
Bilirubin, UA: NEGATIVE
Blood, UA: NEGATIVE
Glucose, UA: NEGATIVE
Ketones, UA: NEGATIVE
Leukocytes, UA: NEGATIVE
Nitrite, UA: NEGATIVE
Protein, UA: NEGATIVE
Spec Grav, UA: 1.025
Urobilinogen, UA: 1
pH, UA: 6.5

## 2013-07-05 MED ORDER — AZITHROMYCIN 500 MG PO TABS: 1000.0000 mg | ORAL_TABLET | Freq: Every day | ORAL | Status: DC

## 2013-07-05 MED ORDER — CEFTRIAXONE SODIUM 1 G IJ SOLR: 250.0000 mg | INTRAMUSCULAR | Status: DC

## 2013-07-05 MED ORDER — CEFTRIAXONE SODIUM 1 G IJ SOLR
250.0000 mg | INTRAMUSCULAR | Status: DC
  Administered 2013-07-05 – 2013-11-16 (×2): 250 mg via INTRAMUSCULAR

## 2013-07-05 NOTE — Progress Notes (Signed)
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 No IV drug use, joint pain or rash Patient on Xarelto for a. fib  Objective:  NAD Genitalia:  Normal  Assessment:  STD exposure  Plan:  Rocephin 250 IM per patient request zithromycin 1 gm po  Signed, Elvina Sidle, MD

## 2013-07-05 NOTE — Patient Instructions (Addendum)
Sexually Transmitted Disease  Sexually transmitted disease (STD) refers to any infection that is passed from person to person during sexual activity. This may happen by way of saliva, semen, blood, vaginal mucus, or urine. Common STDs include:   Gonorrhea.   Chlamydia.   Syphilis.   HIV/AIDS.   Genital herpes.   Hepatitis B and C.   Trichomonas.   Human papillomavirus (HPV).   Pubic lice.  CAUSES   An STD may be spread by bacteria, virus, or parasite. A person can get an STD by:   Sexual intercourse with an infected person.   Sharing sex toys with an infected person.   Sharing needles with an infected person.   Having intimate contact with the genitals, mouth, or rectal areas of an infected person.  SYMPTOMS   Some people may not have any symptoms, but they can still pass the infection to others. Different STDs have different symptoms. Symptoms include:   Painful or bloody urination.   Pain in the pelvis, abdomen, vagina, anus, throat, or eyes.   Skin rash, itching, irritation, growths, or sores (lesions). These usually occur in the genital or anal area.   Abnormal vaginal discharge.   Penile discharge in men.   Soft, flesh-colored skin growths in the genital or anal area.   Fever.   Pain or bleeding during sexual intercourse.   Swollen glands in the groin area.   Yellow skin and eyes (jaundice). This is seen with hepatitis.  DIAGNOSIS   To make a diagnosis, your caregiver may:   Take a medical history.   Perform a physical exam.   Take a specimen (culture) to be examined.   Examine a sample of discharge under a microscope.   Perform blood tests.   Perform a Pap test, if this applies.   Perform a colposcopy.   Perform a laparoscopy.  TREATMENT    Chlamydia, gonorrhea, trichomonas, and syphilis can be cured with antibiotic medicine.   Genital herpes, hepatitis, and HIV can be treated, but not cured, with prescribed medicines. The medicines will lessen the symptoms.   Genital warts  from HPV can be treated with medicine or by freezing, burning (electrocautery), or surgery. Warts may come back.   HPV is a virus and cannot be cured with medicine or surgery.However, abnormal areas may be followed very closely by your caregiver and may be removed from the cervix, vagina, or vulva through office procedures or surgery.  If your diagnosis is confirmed, your recent sexual partners need treatment. This is true even if they are symptom-free or have a negative culture or evaluation. They should not have sex until their caregiver says it is okay.  HOME CARE INSTRUCTIONS   All sexual partners should be informed, tested, and treated for all STDs.   Take your antibiotics as directed. Finish them even if you start to feel better.   Only take over-the-counter or prescription medicines for pain, discomfort, or fever as directed by your caregiver.   Rest.   Eat a balanced diet and drink enough fluids to keep your urine clear or pale yellow.   Do not have sex until treatment is completed and you have followed up with your caregiver. STDs should be checked after treatment.   Keep all follow-up appointments, Pap tests, and blood tests as directed by your caregiver.   Only use latex condoms and water-soluble lubricants during sexual activity. Do not use petroleum jelly or oils.   Avoid alcohol and illegal drugs.   Get vaccinated   for HPV and hepatitis. If you have not received these vaccines in the past, talk to your caregiver about whether one or both might be right for you.   Avoid risky sex practices that can break the skin.  The only way to avoid getting an STD is to avoid all sexual activity.Latex condoms and dental dams (for oral sex) will help lessen the risk of getting an STD, but will not completely eliminate the risk.  SEEK MEDICAL CARE IF:    You have a fever.   You have any new or worsening symptoms.  Document Released: 01/12/2003 Document Revised: 01/14/2012 Document Reviewed:  01/19/2011  [employer] Patient Information 2014 [employer].

## 2013-07-06 LAB — GC/CHLAMYDIA PROBE AMP
CT Probe RNA: NEGATIVE
GC Probe RNA: NEGATIVE

## 2013-07-15 ENCOUNTER — Ambulatory Visit: Payer: BC Managed Care – PPO | Admitting: Internal Medicine

## 2013-07-21 ENCOUNTER — Ambulatory Visit (INDEPENDENT_AMBULATORY_CARE_PROVIDER_SITE_OTHER): Payer: BC Managed Care – PPO | Admitting: Internal Medicine

## 2013-07-21 VITALS — BP 136/90 | HR 61 | Temp 97.8°F | Resp 16 | Ht 76.0 in | Wt 227.0 lb

## 2013-07-21 DIAGNOSIS — Z2089 Contact with and (suspected) exposure to other communicable diseases: Secondary | ICD-10-CM

## 2013-07-21 DIAGNOSIS — Z202 Contact with and (suspected) exposure to infections with a predominantly sexual mode of transmission: Secondary | ICD-10-CM

## 2013-07-21 MED ORDER — CEFTRIAXONE SODIUM 1 G IJ SOLR: 250.0000 mg | INTRAMUSCULAR | Status: DC

## 2013-07-21 MED ORDER — CEFTRIAXONE SODIUM 1 G IJ SOLR
250.0000 mg | Freq: Once | INTRAMUSCULAR | Status: AC
  Administered 2013-07-21: 250 mg via INTRAMUSCULAR

## 2013-07-21 MED ORDER — AZITHROMYCIN 500 MG PO TABS: ORAL_TABLET | ORAL | Status: DC

## 2013-07-21 NOTE — Progress Notes (Signed)
  Subjective:    Patient ID: Kyle Ryan, male    DOB: 1961-04-22, 52 y.o.   MRN: [id number]  HPI concerned that he has another STD Complaining of a feeling like a dysuria for 3-4 d following new contact w/out barrier No d/c See past OVs re STDs Trucker This partner no IVDU He has no other sxt    Review of Systems     Objective:   Physical Exam BP 136/90  Pulse 61  Temp(Src) 97.8 F (36.6 C) (Oral)  Resp 16  Ht 6\' 4"  (1.93 m)  Wt 227 lb (102.967 kg)  BMI 27.64 kg/m2  SpO2 98% No skin lesions       Assessment & Plan:  STD expos   he wants treatment with no testing to cover GC, Chlam and Trich  Meds ordered this encounter  Medications  . azithromycin (ZITHROMAX) 500 MG tablet    Sig: 2 tabs today and then 2 tabs tomorrow-both as single dose    Dispense:  4 tablet    Refill:  0  . cefTRIAXone (ROCEPHIN) injection 250 mg

## 2013-08-05 ENCOUNTER — Other Ambulatory Visit (HOSPITAL_COMMUNITY)
Admission: RE | Admit: 2013-08-05 | Discharge: 2013-08-05 | Disposition: A | Discharge status: Home / Self Care | Payer: BC Managed Care – PPO | Source: Ambulatory Visit | Attending: Emergency Medicine | Admitting: Emergency Medicine

## 2013-08-05 ENCOUNTER — Emergency Department (HOSPITAL_COMMUNITY)
Admission: EM | Admit: 2013-08-05 | Discharge: 2013-08-05 | Disposition: A | Discharge status: Home / Self Care | Payer: BC Managed Care – PPO | Source: Home / Self Care

## 2013-08-05 ENCOUNTER — Encounter (HOSPITAL_COMMUNITY): Payer: Self-pay | Admitting: Emergency Medicine

## 2013-08-05 DIAGNOSIS — N342 Other urethritis: Secondary | ICD-10-CM

## 2013-08-05 DIAGNOSIS — Z113 Encounter for screening for infections with a predominantly sexual mode of transmission: Secondary | ICD-10-CM | POA: Insufficient documentation

## 2013-08-05 DIAGNOSIS — Z202 Contact with and (suspected) exposure to infections with a predominantly sexual mode of transmission: Secondary | ICD-10-CM

## 2013-08-05 DIAGNOSIS — Z2089 Contact with and (suspected) exposure to other communicable diseases: Secondary | ICD-10-CM

## 2013-08-05 MED ORDER — AZITHROMYCIN 250 MG PO TABS
1000.0000 mg | ORAL_TABLET | Freq: Every day | ORAL | Status: DC
  Administered 2013-08-05: 1000 mg via ORAL

## 2013-08-05 MED ORDER — AZITHROMYCIN 250 MG PO TABS
ORAL_TABLET | ORAL | Status: AC
  Filled 2013-08-05: qty 4

## 2013-08-05 MED ORDER — CEFTRIAXONE SODIUM 250 MG IJ SOLR
250.0000 mg | Freq: Once | INTRAMUSCULAR | Status: AC
  Administered 2013-08-05: 250 mg via INTRAMUSCULAR

## 2013-08-05 MED ORDER — LIDOCAINE HCL (PF) 1 % IJ SOLN
INTRAMUSCULAR | Status: AC
  Filled 2013-08-05: qty 5

## 2013-08-05 MED ORDER — CEFTRIAXONE SODIUM 250 MG IJ SOLR
INTRAMUSCULAR | Status: AC
  Filled 2013-08-05: qty 250

## 2013-08-05 NOTE — ED Provider Notes (Signed)
CSN: [id number]     Arrival date & time 08/05/13  1416 History   First MD Initiated Contact with Patient 08/05/13 1449     Chief Complaint  Patient presents with  . Exposure to STD   (Consider location/radiation/quality/duration/timing/severity/associated sxs/prior Treatment) HPI Comments: 52 year old male is complaining of pain along the shaft of the penis and within the glans after intercourse. Also occurs at other times said she has been urinating. He states there is an uncomfortable and often stinging sensation in the glans. He is uncertain as to whether he actually has dysuria but does states he has urinary frequency. The symptoms began approximately 4-5 days ago. He states that his sexual partner called him and stated she might have Trichomonas, not confirmed.   Past Medical History  Diagnosis Date  . DVT (deep venous thrombosis) 10/04/2011    on Xarelto  . Hypertension   . PAF (paroxysmal atrial fibrillation)   . ED (erectile dysfunction)   . Limb pain     LEA VENOUS DUPLEX, 12/31/2011 - positive DVT in femoral and popliteal veins   Past Surgical History  Procedure Laterality Date  . Arthroscopy of knee     Family History  Problem Relation Age of Onset  . Diabetes Mother     Type II  . Hypertension Mother   . Hypertension Brother   . Hypertension Sister   . Diabetes Sister     Type II   History  Substance Use Topics  . Smoking status: Former Smoker    Quit date: 07/23/2011  . Smokeless tobacco: Not on file  . Alcohol Use: Yes    Review of Systems  Constitutional: Negative.   Genitourinary: Positive for penile pain. Negative for urgency, flank pain, decreased urine volume, discharge, penile swelling, scrotal swelling, genital sores and testicular pain.  All other systems reviewed and are negative.    Allergies  Flagyl  Home Medications   Current Outpatient Rx  Name  Route  Sig  Dispense  Refill  . diltiazem (CARDIZEM CD) 240 MG 24 hr capsule   Oral  Take 1 capsule (240 mg total) by mouth daily.   30 capsule   11   . esomeprazole (NEXIUM) 40 MG capsule   Oral   Take 40 mg by mouth daily before breakfast.         . lisinopril (PRINIVIL,ZESTRIL) 40 MG tablet   Oral   Take 1 tablet (40 mg total) by mouth daily. Home medication.  Resume at home dose.   30 tablet   5   . Rivaroxaban (XARELTO) 20 MG TABS   Oral   Take 1 tablet (20 mg total) by mouth daily.   30 tablet   6   . Tamsulosin HCl (FLOMAX) 0.4 MG CAPS   Oral   Take 0.4 mg by mouth daily after supper.           . ALPRAZolam (XANAX) 1 MG tablet   Oral   Take 1 mg by mouth at bedtime as needed.         Marland Kitchen aspirin 81 MG tablet   Oral   Take 81 mg by mouth daily.         Marland Kitchen azithromycin (ZITHROMAX) 500 MG tablet   Oral   Take 2 tablets (1,000 mg total) by mouth daily.   2 tablet   0   . azithromycin (ZITHROMAX) 500 MG tablet      2 tabs today and then 2 tabs tomorrow-both as single dose  4 tablet   0   . ibuprofen (ADVIL,MOTRIN) 800 MG tablet   Oral   Take 1 tablet (800 mg total) by mouth daily as needed. Use with EXTREME caution while taking Xarelto   30 tablet   0   . sildenafil (VIAGRA) 100 MG tablet   Oral   Take 100 mg by mouth daily as needed. Erectile dysfunction            BP 140/89  Pulse 69  Temp(Src) 97.8 F (36.6 C) (Oral)  Resp 17  SpO2 96% Physical Exam  Nursing note and vitals reviewed. Constitutional: He is oriented to person, place, and time. He appears well-developed and well-nourished. No distress.  Neck: Normal range of motion. Neck supple.  Cardiovascular: Normal rate.   Pulmonary/Chest: Effort normal. No respiratory distress.  Genitourinary: Penis normal.  Minor tenderness with firm manual pressure to the penile shaft and glans. No discharge is seen. No external lesions. Both testicles are descended. No enlargement or tenderness. No enlargement or tenderness of the epididymal apparatus. No palpable regional  lymphadenopathy.  Neurological: He is alert and oriented to person, place, and time. He exhibits normal muscle tone.  Skin: Skin is warm and dry.  Psychiatric: He has a normal mood and affect.    ED Course  Procedures (including critical care time) Labs Review Labs Reviewed  URINE CULTURE  URINE CYTOLOGY ANCILLARY ONLY   Imaging Review No results found. Results for orders placed in visit on 07/05/13  GC/CHLAMYDIA PROBE AMP      Result Value Range   CT Probe RNA NEGATIVE     GC Probe RNA NEGATIVE    POCT UA - MICROSCOPIC ONLY      Result Value Range   WBC, Ur, HPF, POC neg     RBC, urine, microscopic neg     Bacteria, U Microscopic trace     Mucus, UA mod     Epithelial cells, urine per micros 0-1     Crystals, Ur, HPF, POC neg     Casts, Ur, LPF, POC neg     Yeast, UA neg    POCT URINALYSIS DIPSTICK      Result Value Range   Color, UA yellow     Clarity, UA clear     Glucose, UA neg     Bilirubin, UA neg     Ketones, UA neg     Spec Grav, UA 1.025     Blood, UA neg     pH, UA 6.5     Protein, UA neg     Urobilinogen, UA 1.0     Nitrite, UA neg     Leukocytes, UA Negative      MDM   1. Exposure to STD   2. Urethritis      Rocephin 250 mg IM Azithromycin 1 gm po Ancillary testing for STD pending.  Hayden Rasmussen, NP 08/05/13 1554

## 2013-08-05 NOTE — Discharge Instructions (Signed)
Sexually Transmitted Disease  Sexually transmitted disease (STD) refers to any infection that is passed from person to person during sexual activity. This may happen by way of saliva, semen, blood, vaginal mucus, or urine. Common STDs include:   Gonorrhea.   Chlamydia.   Syphilis.   HIV/AIDS.   Genital herpes.   Hepatitis B and C.   Trichomonas.   Human papillomavirus (HPV).   Pubic lice.  CAUSES   An STD may be spread by bacteria, virus, or parasite. A person can get an STD by:   Sexual intercourse with an infected person.   Sharing sex toys with an infected person.   Sharing needles with an infected person.   Having intimate contact with the genitals, mouth, or rectal areas of an infected person.  SYMPTOMS   Some people may not have any symptoms, but they can still pass the infection to others. Different STDs have different symptoms. Symptoms include:   Painful or bloody urination.   Pain in the pelvis, abdomen, vagina, anus, throat, or eyes.   Skin rash, itching, irritation, growths, or sores (lesions). These usually occur in the genital or anal area.   Abnormal vaginal discharge.   Penile discharge in men.   Soft, flesh-colored skin growths in the genital or anal area.   Fever.   Pain or bleeding during sexual intercourse.   Swollen glands in the groin area.   Yellow skin and eyes (jaundice). This is seen with hepatitis.  DIAGNOSIS   To make a diagnosis, your caregiver may:   Take a medical history.   Perform a physical exam.   Take a specimen (culture) to be examined.   Examine a sample of discharge under a microscope.   Perform blood tests.   Perform a Pap test, if this applies.   Perform a colposcopy.   Perform a laparoscopy.  TREATMENT    Chlamydia, gonorrhea, trichomonas, and syphilis can be cured with antibiotic medicine.   Genital herpes, hepatitis, and HIV can be treated, but not cured, with prescribed medicines. The medicines will lessen the symptoms.   Genital warts  from HPV can be treated with medicine or by freezing, burning (electrocautery), or surgery. Warts may come back.   HPV is a virus and cannot be cured with medicine or surgery.However, abnormal areas may be followed very closely by your caregiver and may be removed from the cervix, vagina, or vulva through office procedures or surgery.  If your diagnosis is confirmed, your recent sexual partners need treatment. This is true even if they are symptom-free or have a negative culture or evaluation. They should not have sex until their caregiver says it is okay.  HOME CARE INSTRUCTIONS   All sexual partners should be informed, tested, and treated for all STDs.   Take your antibiotics as directed. Finish them even if you start to feel better.   Only take over-the-counter or prescription medicines for pain, discomfort, or fever as directed by your caregiver.   Rest.   Eat a balanced diet and drink enough fluids to keep your urine clear or pale yellow.   Do not have sex until treatment is completed and you have followed up with your caregiver. STDs should be checked after treatment.   Keep all follow-up appointments, Pap tests, and blood tests as directed by your caregiver.   Only use latex condoms and water-soluble lubricants during sexual activity. Do not use petroleum jelly or oils.   Avoid alcohol and illegal drugs.   Get vaccinated  for HPV and hepatitis. If you have not received these vaccines in the past, talk to your caregiver about whether one or both might be right for you.   Avoid risky sex practices that can break the skin.  The only way to avoid getting an STD is to avoid all sexual activity.Latex condoms and dental dams (for oral sex) will help lessen the risk of getting an STD, but will not completely eliminate the risk.  SEEK MEDICAL CARE IF:    You have a fever.   You have any new or worsening symptoms.  Document Released: 01/12/2003 Document Revised: 01/14/2012 Document Reviewed:  01/19/2011  [facility] Patient Information 2014 [place], [place].  Urethritis, Adult  Urethritis is an inflammation (soreness) of the urethra (the tube exiting from the bladder). It is often caused by germs that may be spread through sexual contact.  TREATMENT   Urethritis will usually respond to antibiotics. These are medications that kill germs. Take all the medicine given to you. You may feel better in a couple days, but TAKE ALL MEDICINE or the infection may not be completely cured and may become more difficult to treat. Response can generally be expected in 7 to 10 days. You may require additional treatment after more testing.  HOME CARE INSTRUCTIONS   Not have sex until the test results are known and treatment is completed.   Know that you may be asked to notify your sex partner when your final test results are back.   Finish all medications as prescribed.   Prevent sexually transmitted infections including AIDS. Practice safe sex. Use condoms.  SEEK MEDICAL CARE IF:    Your symptoms are not improved in 2 to 3 days.   Your symptoms are getting worse.   Your develop abdominal pain.   You develop joint pain.  SEEK IMMEDIATE MEDICAL CARE IF:    You have a fever.   You develop severe pain in the belly, back or side.   You develop repeated vomiting.  TEST RESULTS  Not all test results are available during your visit. If your test results are not back during the visit, make an appointment with your caregiver to find out the results. Do not assume everything is normal if you have not heard from your caregiver or the medical facility. It is important for you to follow-up on all of your test results.  Document Released: 04/17/2001 Document Revised: 01/14/2012 Document Reviewed: 11/07/2009  [facility] Patient Information 2014 [place], [place].

## 2013-08-05 NOTE — ED Notes (Signed)
Pt c/o poss STD exposure C/o intermittent discomfort/pain in the penis Denies: fevers, penile d/c States his partner went to be checked for Trich and results were neg Alert w/no signs of acute distress.

## 2013-08-05 NOTE — ED Notes (Signed)
PT waited for 20 minutes but left w/o singing out and w/o d/c instructions.

## 2013-08-06 LAB — URINE CULTURE
Colony Count: NO GROWTH
Culture: NO GROWTH

## 2013-08-06 NOTE — ED Provider Notes (Signed)
Medical screening examination/treatment/procedure(s) were performed by non-physician practitioner and as supervising physician I was immediately available for consultation/collaboration.  Leslee Home, M.D.  Reuben Likes, MD 08/06/13 [phone]

## 2013-08-09 NOTE — ED Notes (Signed)
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

## 2013-08-11 NOTE — ED Notes (Signed)
After  Id   Verified  Lab  Results  Given to pt

## 2013-08-11 NOTE — ED Notes (Signed)
Pt. came to [facility]  His lab results.  Verified pt. with picture ID and told his results.  Pt. wants copies of his lab results.  Pt.'s ID copied and labs printed, while pt. filled out medical release form. Pt. given copies in a brown envelope. Vassie Moselle 08/11/2013

## 2013-08-12 ENCOUNTER — Other Ambulatory Visit: Payer: Self-pay | Admitting: Family Medicine

## 2013-08-12 ENCOUNTER — Other Ambulatory Visit: Payer: Self-pay | Admitting: Nurse Practitioner

## 2013-08-13 ENCOUNTER — Other Ambulatory Visit: Payer: Self-pay | Admitting: Nurse Practitioner

## 2013-08-15 ENCOUNTER — Other Ambulatory Visit: Payer: Self-pay | Admitting: Nurse Practitioner

## 2013-08-17 ENCOUNTER — Telehealth: Payer: Self-pay | Admitting: *Deleted

## 2013-08-17 MED ORDER — DILTIAZEM HCL ER COATED BEADS 240 MG PO CP24: 240.0000 mg | ORAL_CAPSULE | Freq: Every day | ORAL | Status: DC

## 2013-08-17 NOTE — Telephone Encounter (Signed)
Needs appointment with Norma Fredrickson

## 2013-08-26 ENCOUNTER — Other Ambulatory Visit: Payer: Self-pay | Admitting: *Deleted

## 2013-08-26 ENCOUNTER — Encounter: Payer: Self-pay | Admitting: *Deleted

## 2013-08-26 ENCOUNTER — Encounter: Payer: Self-pay | Admitting: Cardiology

## 2013-08-27 ENCOUNTER — Encounter: Payer: Self-pay | Admitting: Cardiovascular Disease

## 2013-09-02 ENCOUNTER — Ambulatory Visit: Payer: BC Managed Care – PPO | Admitting: Nurse Practitioner

## 2013-09-09 ENCOUNTER — Ambulatory Visit (INDEPENDENT_AMBULATORY_CARE_PROVIDER_SITE_OTHER): Payer: BC Managed Care – PPO | Admitting: Nurse Practitioner

## 2013-09-09 ENCOUNTER — Encounter: Payer: Self-pay | Admitting: Nurse Practitioner

## 2013-09-09 VITALS — BP 140/90 | HR 66 | Ht 76.5 in | Wt 226.4 lb

## 2013-09-09 DIAGNOSIS — Z79899 Other long term (current) drug therapy: Secondary | ICD-10-CM

## 2013-09-09 DIAGNOSIS — Z7901 Long term (current) use of anticoagulants: Secondary | ICD-10-CM

## 2013-09-09 LAB — CBC WITH DIFFERENTIAL/PLATELET
Basophils Absolute: 0 10*3/uL (ref 0.0–0.1)
Basophils Relative: 0.5 % (ref 0.0–3.0)
Eosinophils Absolute: 0.1 10*3/uL (ref 0.0–0.7)
Eosinophils Relative: 1.8 % (ref 0.0–5.0)
HCT: 40.5 % (ref 39.0–52.0)
Hemoglobin: 13.7 g/dL (ref 13.0–17.0)
Lymphocytes Relative: 30.8 % (ref 12.0–46.0)
Lymphs Abs: 1.2 10*3/uL (ref 0.7–4.0)
MCHC: 33.7 g/dL (ref 30.0–36.0)
MCV: 95.2 fl (ref 78.0–100.0)
Monocytes Absolute: 0.3 10*3/uL (ref 0.1–1.0)
Monocytes Relative: 7.1 % (ref 3.0–12.0)
Neutro Abs: 2.4 10*3/uL (ref 1.4–7.7)
Neutrophils Relative %: 59.8 % (ref 43.0–77.0)
Platelets: 158 10*3/uL (ref 150.0–400.0)
RBC: 4.26 Mil/uL (ref 4.22–5.81)
RDW: 13.3 % (ref 11.5–14.6)
WBC: 3.9 10*3/uL — ABNORMAL LOW (ref 4.5–10.5)

## 2013-09-09 LAB — BASIC METABOLIC PANEL
BUN: 15 mg/dL (ref 6–23)
CO2: 26 mEq/L (ref 19–32)
Calcium: 9.1 mg/dL (ref 8.4–10.5)
Chloride: 107 mEq/L (ref 96–112)
Creatinine, Ser: 1.1 mg/dL (ref 0.4–1.5)
GFR: 91.52 mL/min (ref >60.00)
Glucose, Bld: 82 mg/dL (ref 70–99)
Potassium: 3.8 mEq/L (ref 3.5–5.1)
Sodium: 139 mEq/L (ref 135–145)

## 2013-09-09 MED ORDER — DILTIAZEM HCL ER COATED BEADS 240 MG PO CP24: 240.0000 mg | ORAL_CAPSULE | Freq: Every day | ORAL | Status: DC

## 2013-09-09 MED ORDER — LISINOPRIL 40 MG PO TABS: 40.0000 mg | ORAL_TABLET | Freq: Every day | ORAL | Status: DC

## 2013-09-09 NOTE — Patient Instructions (Signed)
Lets check your labs today  I have refilled your Lisinopril and Cardizem  I will see you in a year  Call the [facility] office at [phone] if you have any questions, problems or concerns.

## 2013-09-09 NOTE — Progress Notes (Signed)
Kyle Ryan Date of Birth: Mar 15, 1961 Medical Record #[id number]  History of Present Illness: Kyle Ryan is seen back today for a follow up visit. It is a one year check. He is seen for Dr. Rennis Golden. He is a former patient of Dr. Ronnald Nian. He has had PAF, ED, HTN and past tobacco/alcohol/marijuana use. 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   Seen by me last year after not having seen him since July of 2011. Has seen Dr. Rennis Golden in the interim - last time in July of 2014 - noted to have chronic DVT - has been left on chronic Xarelto.   Comes back today. Here alone. Doing ok. No chest pain. Not short of breath. Tolerating his medicines but has ran out of his BP/AF meds. BP up slightly. Mom just had a stroke - he is pretty upset about that. No recent labs. Not dizzy or lightheaded.   Current Outpatient Prescriptions  Medication Sig Dispense Refill  . ALPRAZolam (XANAX) 1 MG tablet Take 1 mg by mouth at bedtime as needed.      Marland Kitchen aspirin 81 MG tablet Take 81 mg by mouth daily.      Marland Kitchen diltiazem (CARDIZEM CD) 240 MG 24 hr capsule Take 1 capsule (240 mg total) by mouth daily.  30 capsule  0  . esomeprazole (NEXIUM) 40 MG capsule Take 40 mg by mouth daily before breakfast.      . ibuprofen (ADVIL,MOTRIN) 800 MG tablet Take 1 tablet (800 mg total) by mouth daily as needed. Use with EXTREME caution while taking Xarelto  30 tablet  0  . lisinopril (PRINIVIL,ZESTRIL) 40 MG tablet Take 1 tablet (40 mg total) by mouth daily. PATIENT NEEDS OFFICE VISIT FOR ADDITIONAL REFILLS  30 tablet  0  . Rivaroxaban (XARELTO) 20 MG TABS Take 1 tablet (20 mg total) by mouth daily.  30 tablet  6  . sildenafil (VIAGRA) 100 MG tablet Take 100 mg by mouth daily as needed. Erectile dysfunction        . Tamsulosin HCl (FLOMAX) 0.4 MG CAPS Take 0.4 mg by mouth daily after supper.         Current Facility-Administered Medications  Medication Dose Route Frequency Provider Last Rate Last Dose  . cefTRIAXone (ROCEPHIN) injection 250 mg   250 mg Intramuscular Q24H Elvina Sidle, MD   250 mg at 07/05/13 3086    Allergies  Allergen Reactions  . Flagyl [Metronidazole Hcl] Rash    Past Medical History  Diagnosis Date  . DVT (deep venous thrombosis) 10/04/2011    on Xarelto  . Hypertension   . PAF (paroxysmal atrial fibrillation)   . ED (erectile dysfunction)   . Limb pain     LEA VENOUS DUPLEX, 12/31/2011 - positive DVT in femoral and popliteal veins    Past Surgical History  Procedure Laterality Date  . Arthroscopy of knee      History  Smoking status  . Former Smoker  . Quit date: 07/23/2011  Smokeless tobacco  . Not on file    History  Alcohol Use  . Yes    Family History  Problem Relation Age of Onset  . Diabetes Mother     Type II  . Hypertension Mother   . Hypertension Brother   . Hypertension Sister   . Diabetes Sister     Type II    Review of Systems: The review of systems is per the HPI.  All other systems were reviewed and are negative.  Physical  Exam: BP 140/90  Pulse 66  Ht 6' 4.5" (1.943 m)  Wt 226 lb 6.4 oz (102.694 kg)  BMI 27.20 kg/m2  SpO2 96% Patient is very pleasant and in no acute distress. Skin is warm and dry. Color is normal.  HEENT is unremarkable. Normocephalic/atraumatic. PERRL. Sclera are nonicteric. Neck is supple. No masses. No JVD. Lungs are clear. Cardiac exam shows a regular rate and rhythm. Abdomen is soft. Extremities are without edema. Gait and ROM are intact. No gross neurologic deficits noted.  LABORATORY DATA: Lab Results  Component Value Date   WBC 5.3 08/19/2012   HGB 14.6 08/19/2012   HCT 46.8 08/19/2012   PLT 132* 10/05/2011   GLUCOSE 80 08/19/2012   CHOL 181 03/08/2012   TRIG 73 03/08/2012   HDL 60 03/08/2012   LDLCALC 161* 03/08/2012   ALT 25 08/19/2012   AST 34 08/19/2012   NA 144 08/19/2012   K 4.0 08/19/2012   CL 110 08/19/2012   CREATININE 1.17 08/19/2012   BUN 15 08/19/2012   CO2 25 08/19/2012   TSH 1.071 03/08/2012   PSA 0.75  08/19/2012   INR 1.18 10/05/2011     Assessment / Plan: 1. PAF - in sinus on exam today - medicines refilled. See him back in a year.   2. DVT - on Xarelto. Needs repeat labs.   3. HTN - blood pressure up slightly but has been out of his medicines - these are refilled today.   I will see him back in a year.   Patient is agreeable to this plan and will call if any problems develop in the interim.   Rosalio Macadamia, RN, ANP-C [facility] [address] [place], [place]  [address]

## 2013-10-27 ENCOUNTER — Other Ambulatory Visit: Payer: Self-pay | Admitting: *Deleted

## 2013-10-27 ENCOUNTER — Telehealth: Payer: Self-pay | Admitting: Internal Medicine

## 2013-10-27 DIAGNOSIS — R600 Localized edema: Secondary | ICD-10-CM

## 2013-10-27 DIAGNOSIS — M7989 Other specified soft tissue disorders: Secondary | ICD-10-CM

## 2013-10-27 NOTE — Telephone Encounter (Signed)
He wants to talk to somebody about getting compression hose.

## 2013-10-27 NOTE — Progress Notes (Signed)
Corine Shelter, PA-C notified and signed order.

## 2013-10-27 NOTE — Telephone Encounter (Signed)
Returned call and pt verified x 2.  Pt stated he needs new compression hose.  Stated they are worn out and he needs new ones.  Stated he wears a size 5.  Reviewed chart and it is documented pt wears compression hose.  Joyce Gross, Nutritional therapist, notified and advised new order needed and pt can come in to pick up hose.  Pt informed and verbalized understanding.  Pt will come in to pick up today.  Order placed for two compression stockings.

## 2013-11-16 ENCOUNTER — Ambulatory Visit: Payer: BC Managed Care – PPO | Admitting: Family Medicine

## 2013-11-16 VITALS — BP 123/82 | HR 50 | Temp 97.6°F | Resp 18 | Wt 231.0 lb

## 2013-11-16 DIAGNOSIS — Z202 Contact with and (suspected) exposure to infections with a predominantly sexual mode of transmission: Secondary | ICD-10-CM

## 2013-11-16 DIAGNOSIS — Z9189 Other specified personal risk factors, not elsewhere classified: Secondary | ICD-10-CM

## 2013-11-16 DIAGNOSIS — R369 Urethral discharge, unspecified: Secondary | ICD-10-CM

## 2013-11-16 LAB — POCT UA - MICROSCOPIC ONLY
Bacteria, U Microscopic: NEGATIVE
Casts, Ur, LPF, POC: NEGATIVE
Crystals, Ur, HPF, POC: NEGATIVE
Epithelial cells, urine per micros: NEGATIVE
Mucus, UA: NEGATIVE
RBC, urine, microscopic: NEGATIVE
WBC, Ur, HPF, POC: NEGATIVE
Yeast, UA: NEGATIVE

## 2013-11-16 LAB — POCT URINALYSIS DIPSTICK
Bilirubin, UA: NEGATIVE
Blood, UA: NEGATIVE
Glucose, UA: NEGATIVE
Ketones, UA: NEGATIVE
Leukocytes, UA: NEGATIVE
Nitrite, UA: NEGATIVE
Protein, UA: NEGATIVE
Spec Grav, UA: 1.01
Urobilinogen, UA: 0.2
pH, UA: 6.5

## 2013-11-16 MED ORDER — TINIDAZOLE 500 MG PO TABS: 2.0000 g | ORAL_TABLET | Freq: Once | ORAL | Status: DC

## 2013-11-16 MED ORDER — AZITHROMYCIN 500 MG PO TABS: 1000.0000 mg | ORAL_TABLET | Freq: Once | ORAL | Status: DC

## 2013-11-16 NOTE — Progress Notes (Signed)
This chart was scribed for [name], MD by Luisa Dago, ED Scribe. This patient was seen in room 5 and the patient's care was started at 4:35 PM. Subjective:    Patient ID: Kyle Ryan, male    DOB: [birth year], 53 y.o.   MRN: [id number]  Chief Complaint  Patient presents with  . std testing   HPI HPI Comments: Kyle Ryan is a 52 y.o. male who presents to [facility] requesting STD testing. He reports experiencing penile discharge and dysuria that started 4-5 days ago. He describes the discharges as "a clear white gel" like clear dishwashing detergent. He denies constipation, rash, back pain, nausea, vomiting, fever, chills, abdominal pain. Pt has been suffering intermittently from dysuria and penile discharge for the past 6 months. He states that he has been treated 3 times with Rosephin and Azithromycin. However, he is concerned that perhaps his symptoms are due to trichomoniasis so he is requesting the treatment for this particular infection despite his allergy reaction (rash) to metronidazole. He never received definitive testing for Trichomoniasis although he has had multiple microscopic UAs that never showed trich.  He is not in a monogamous relationship. States last sexual encounter was a week ago and "the condom slipped" and he didn't realize it. No lesions, ulcers, or rashes. No h/o any HSV type lesions or concerns and declines HSV blood testing.   Past Medical History  Diagnosis Date  . DVT (deep venous thrombosis) 10/04/2011    on Xarelto  . Hypertension   . PAF (paroxysmal atrial fibrillation)   . ED (erectile dysfunction)   . Limb pain     LEA VENOUS DUPLEX, 12/31/2011 - positive DVT in femoral and popliteal veins   Allergies  Allergen Reactions  . Flagyl [Metronidazole Hcl] Rash   Current Outpatient Prescriptions on File Prior to Visit  Medication Sig Dispense Refill  . aspirin 81 MG tablet Take 81 mg by mouth daily.      Marland Kitchen diltiazem (CARDIZEM CD) 240 MG 24 hr capsule Take  1 capsule (240 mg total) by mouth daily.  30 capsule  11  . esomeprazole (NEXIUM) 40 MG capsule Take 40 mg by mouth daily before breakfast.      . ibuprofen (ADVIL,MOTRIN) 800 MG tablet Take 1 tablet (800 mg total) by mouth daily as needed. Use with EXTREME caution while taking Xarelto  30 tablet  0  . lisinopril (PRINIVIL,ZESTRIL) 40 MG tablet Take 1 tablet (40 mg total) by mouth daily.  30 tablet  11  . Rivaroxaban (XARELTO) 20 MG TABS Take 1 tablet (20 mg total) by mouth daily.  30 tablet  6  . sildenafil (VIAGRA) 100 MG tablet Take 100 mg by mouth daily as needed. Erectile dysfunction        . Tamsulosin HCl (FLOMAX) 0.4 MG CAPS Take 0.4 mg by mouth daily after supper.        . ALPRAZolam (XANAX) 1 MG tablet Take 1 mg by mouth at bedtime as needed.       Current Facility-Administered Medications on File Prior to Visit  Medication Dose Route Frequency Provider Last Rate Last Dose  . cefTRIAXone (ROCEPHIN) injection 250 mg  250 mg Intramuscular Q24H Elvina Sidle, MD   250 mg at 07/05/13 4098    Review of Systems  Constitutional: Negative for fever and chills.  Gastrointestinal: Negative for nausea, vomiting, abdominal pain and constipation.  Genitourinary: Positive for dysuria and discharge.  Musculoskeletal: Negative for back pain.  Skin: Negative for rash.  Triage vitals: BP 123/82  Pulse 50  Temp(Src) 97.6 F (36.4 C) (Oral)  Resp 18  Wt 231 lb (104.781 kg)  SpO2 99% Objective:   Physical Exam  Nursing note and vitals reviewed. Constitutional: He is oriented to person, place, and time. He appears well-developed and well-nourished.  HENT:  Head: Normocephalic and atraumatic.  Neck: No mass and no thyromegaly present.  Cardiovascular: Normal rate, regular rhythm, S1 normal, S2 normal and normal heart sounds.  Exam reveals no gallop and no friction rub.   No murmur heard. Pulmonary/Chest: Effort normal and breath sounds normal. No respiratory distress. He has no  wheezes. He has no rales.  Abdominal: Soft. Bowel sounds are normal. He exhibits no distension. There is tenderness (Mild in LLQ). There is no CVA tenderness.  Neurological: He is alert and oriented to person, place, and time.  Skin: Skin is warm and dry.  Psychiatric: He has a normal mood and affect.      Results for orders placed in visit on 11/16/13  HIV ANTIBODY (ROUTINE TESTING)      Result Value Range   HIV NON REACTIVE  NON REACTIVE  RPR      Result Value Range   RPR NON REAC  NON REAC  HEPATITIS C ANTIBODY      Result Value Range   HCV Ab NEGATIVE  NEGATIVE  POCT UA - MICROSCOPIC ONLY      Result Value Range   WBC, Ur, HPF, POC neg     RBC, urine, microscopic neg     Bacteria, U Microscopic neg     Mucus, UA neg     Epithelial cells, urine per micros neg     Crystals, Ur, HPF, POC neg     Casts, Ur, LPF, POC neg     Yeast, UA neg    POCT URINALYSIS DIPSTICK      Result Value Range   Color, UA yellow     Clarity, UA clear     Glucose, UA neg     Bilirubin, UA neg     Ketones, UA neg     Spec Grav, UA 1.010     Blood, UA neg     pH, UA 6.5     Protein, UA neg     Urobilinogen, UA 0.2     Nitrite, UA neg     Leukocytes, UA Negative     Assessment & Plan:  Penile discharge - Plan: POCT UA - Microscopic Only, POCT urinalysis dipstick, GC/Chlamydia Probe Amp, HIV antibody, RPR, Hepatitis C antibody, Trichomonas vaginalis, RNA  Possible exposure to STD - Plan: POCT UA - Microscopic Only, POCT urinalysis dipstick, GC/Chlamydia Probe Amp, HIV antibody, RPR, Hepatitis C antibody, Trichomonas vaginalis, RNA - 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 Has been repeatedly treated for gc/chlam w/ rocephin and azithro but his sxs cont. Has had repeated potential exposures but has not been tested or treated for trich so he suspects that perhaps this has been the cause all along.  He does have a rash w/ metronidazole but would be willing to take it anyway to trx  the infection - fine w/ trying tindamax though has also potential for allergic reaction.  Meds ordered this encounter  Medications  . tinidazole (TINDAMAX) 500 MG tablet    Sig: Take 4 tablets (2,000 mg total) by mouth once.    Dispense:  4 tablet    Refill:  0  . azithromycin (ZITHROMAX) 500  MG tablet    Sig: Take 2 tablets (1,000 mg total) by mouth once.    Dispense:  2 tablet    Refill:  0    I personally performed the services described in this documentation, which was scribed in my presence. The recorded information has been reviewed and considered, and addended by me as needed.  Norberto SorensonEva , MD MPH

## 2013-11-16 NOTE — Patient Instructions (Signed)
Sexually Transmitted Disease A sexually transmitted disease (STD) is a disease or infection that may be passed (transmitted) from person to person, usually during sexual activity. This may happen by way of saliva, semen, blood, vaginal mucus, or urine. Common STDs include:   Gonorrhea.   Chlamydia.   Syphilis.   HIV and AIDS.   Genital herpes.   Hepatitis B and C.   Trichomonas.   Human papillomavirus (HPV).   Pubic lice.   Scabies.  Mites.  Bacterial vaginosis. WHAT ARE CAUSES OF STDs? An STD may be caused by bacteria, a virus, or parasites. STDs are often transmitted during sexual activity if one person is infected. However, they may also be transmitted through nonsexual means. STDs may be transmitted after:   Sexual intercourse with an infected person.   Sharing sex toys with an infected person.   Sharing needles with an infected person or using unclean piercing or tattoo needles.  Having intimate contact with the genitals, mouth, or rectal areas of an infected person.   Exposure to infected fluids during birth. WHAT ARE THE SIGNS AND SYMPTOMS OF STDs? Different STDs have different symptoms. Some people may not have any symptoms. If symptoms are present, they may include:   Painful or bloody urination.   Pain in the pelvis, abdomen, vagina, anus, throat, or eyes.   Skin rash, itching, irritation, growths, sores (lesions), ulcerations, or warts in the genital or anal area.  Abnormal vaginal discharge with or without bad odor.   Penile discharge in men.   Fever.   Pain or bleeding during sexual intercourse.   Swollen glands in the groin area.   Yellow skin and eyes (jaundice). This is seen with hepatitis.   Swollen testicles.  Infertility.  Sores and blisters in the mouth. HOW ARE STDs DIAGNOSED? To make a diagnosis, your health care provider may:   Take a medical history.   Perform a physical exam.   Take a sample of any  discharge for examination.  Swab the throat, cervix, opening to the penis, rectum, or vagina for testing.  Test a sample of your first morning urine.   Perform blood tests.   Perform a Pap smear, if this applies.   Perform a colposcopy.   Perform a laparoscopy.  HOW ARE STDs TREATED? Treatment depends on the STD. Some STDs may be treated but not cured.   Chlamydia, gonorrhea, trichomonas, and syphilis can be cured with antibiotics.   Genital herpes, hepatitis, and HIV can be treated, but not cured, with prescribed medicines. The medicines lessen symptoms.   Genital warts from HPV can be treated with medicine or by freezing, burning (electrocautery), or surgery. Warts may come back.   HPV cannot be cured with medicine or surgery. However, abnormal areas may be removed from the cervix, vagina, or vulva.   If your diagnosis is confirmed, your recent sexual partners need treatment. This is true even if they are symptom-free or have a negative culture or evaluation. They should not have sex until their health care providers say it is OK. HOW CAN I REDUCE MY RISK OF GETTING AN STD?  Use latex condoms, dental dams, and water-soluble lubricants during sexual activity. Do not use petroleum jelly or oils.  Get vaccinated for HPV and hepatitis. If you have not received these vaccines in the past, talk to your health care provider about whether one or both might be right for you.   Avoid risky sex practices that can break the skin.  WHAT SHOULD   I DO IF I THINK I HAVE AN STD?  See your health care provider.   Inform all sexual partners. They should be tested and treated for any STDs.  Do not have sex until your health care provider says it is OK. WHEN SHOULD I GET HELP? Seek immediate medical care if:  You develop severe abdominal pain.  You are a man and notice swelling or pain in the testicles.  You are a woman and notice swelling or pain in your vagina. Document  Released: 01/12/2003 Document Revised: 08/12/2013 Document Reviewed: 05/12/2013 [employer] Patient Information 2014 [employer].  

## 2013-11-17 ENCOUNTER — Encounter: Payer: Self-pay | Admitting: Family Medicine

## 2013-11-17 LAB — HIV ANTIBODY (ROUTINE TESTING W REFLEX): HIV: NONREACTIVE

## 2013-11-17 LAB — GC/CHLAMYDIA PROBE AMP
CT Probe RNA: NEGATIVE
GC Probe RNA: NEGATIVE

## 2013-11-17 LAB — HEPATITIS C ANTIBODY: HCV Ab: NEGATIVE

## 2013-11-17 LAB — RPR

## 2013-11-17 LAB — TRICHOMONAS VAGINALIS, PROBE AMP: T vaginalis RNA: NEGATIVE

## 2014-02-13 ENCOUNTER — Encounter (HOSPITAL_COMMUNITY): Payer: Self-pay | Admitting: Emergency Medicine

## 2014-02-13 ENCOUNTER — Emergency Department (HOSPITAL_COMMUNITY)
Admission: EM | Admit: 2014-02-13 | Discharge: 2014-02-13 | Disposition: A | Discharge status: Home / Self Care | Payer: BC Managed Care – PPO | Source: Home / Self Care

## 2014-02-13 DIAGNOSIS — M79609 Pain in unspecified limb: Secondary | ICD-10-CM

## 2014-02-13 DIAGNOSIS — M5410 Radiculopathy, site unspecified: Secondary | ICD-10-CM

## 2014-02-13 DIAGNOSIS — IMO0002 Reserved for concepts with insufficient information to code with codable children: Secondary | ICD-10-CM

## 2014-02-13 DIAGNOSIS — M545 Low back pain, unspecified: Secondary | ICD-10-CM

## 2014-02-13 DIAGNOSIS — M7918 Myalgia, other site: Secondary | ICD-10-CM

## 2014-02-13 DIAGNOSIS — M79606 Pain in leg, unspecified: Secondary | ICD-10-CM

## 2014-02-13 DIAGNOSIS — M79659 Pain in unspecified thigh: Secondary | ICD-10-CM

## 2014-02-13 MED ORDER — METHYLPREDNISOLONE 4 MG PO KIT: PACK | ORAL | Status: DC

## 2014-02-13 MED ORDER — OMEPRAZOLE 20 MG PO CPDR: 20.0000 mg | DELAYED_RELEASE_CAPSULE | Freq: Every day | ORAL | Status: DC

## 2014-02-13 MED ORDER — TRAMADOL HCL 50 MG PO TABS: 50.0000 mg | ORAL_TABLET | Freq: Four times a day (QID) | ORAL | Status: DC | PRN

## 2014-02-13 NOTE — ED Notes (Addendum)
Pt c/o left side hip pain onset 3-4 days Pain radiates down to left leg; increases when sitting down Hx of back pain; sees ortho for back issues; given steroids and muscle relaxer States his leg feels like it's burning  Noticed the pain after doing some yard work Denies inj/trauma BP today is 150/103; Hx of Afib and DVT Alert w/no signs of acute distress; ambulated well to exam room w/NAD

## 2014-02-13 NOTE — ED Provider Notes (Signed)
CSN: [id number]     Arrival date & time 02/13/14  0906 History   First MD Initiated Contact with Patient 02/13/14 0914     Chief Complaint  Patient presents with  . Hip Pain   (Consider location/radiation/quality/duration/timing/severity/associated sxs/prior Treatment) HPI Comments: 53 year old male presents to the urgent care with complaints of left back pain radiating to the left hip and anterior thigh. This started a little over a week ago. He has seen his orthopedist for this problem and was treated with muscle relaxants and a short course of steroids. He states that he is not much better but in the past one to 2 days it has gotten worse. There is no known injury or trauma. He does work with lifting and other physical demanding activities. He describes the pain as a burning type pain with muscle twitches in the anterior and medial thigh. Pain is worse with sitting and somewhat improved with standing.   Past Medical History  Diagnosis Date  . DVT (deep venous thrombosis) 10/04/2011    on Xarelto  . Hypertension   . PAF (paroxysmal atrial fibrillation)   . ED (erectile dysfunction)   . Limb pain     LEA VENOUS DUPLEX, 12/31/2011 - positive DVT in femoral and popliteal veins   Past Surgical History  Procedure Laterality Date  . Arthroscopy of knee     Family History  Problem Relation Age of Onset  . Diabetes Mother     Type II  . Hypertension Mother   . Stroke Mother   . Arrhythmia Mother   . Hypertension Brother   . Hypertension Sister   . Diabetes Sister     Type II   History  Substance Use Topics  . Smoking status: Former Smoker    Quit date: 07/23/2011  . Smokeless tobacco: Not on file  . Alcohol Use: Yes    Review of Systems  Constitutional: Negative.  Negative for fever.  Respiratory: Negative.   Gastrointestinal: Negative.   Genitourinary: Negative.   Musculoskeletal: Positive for back pain and myalgias.       As per HPI  Skin: Negative.   Neurological:  Negative for dizziness, weakness, numbness and headaches.    Allergies  Flagyl  Home Medications   Current Outpatient Rx  Name  Route  Sig  Dispense  Refill  . diltiazem (CARDIZEM CD) 240 MG 24 hr capsule   Oral   Take 1 capsule (240 mg total) by mouth daily.   30 capsule   11   . lisinopril (PRINIVIL,ZESTRIL) 40 MG tablet   Oral   Take 1 tablet (40 mg total) by mouth daily.   30 tablet   11   . Rivaroxaban (XARELTO) 20 MG TABS   Oral   Take 1 tablet (20 mg total) by mouth daily.   30 tablet   6   . ALPRAZolam (XANAX) 1 MG tablet   Oral   Take 1 mg by mouth at bedtime as needed.         Marland Kitchen aspirin 81 MG tablet   Oral   Take 81 mg by mouth daily.         Marland Kitchen azithromycin (ZITHROMAX) 500 MG tablet   Oral   Take 2 tablets (1,000 mg total) by mouth once.   2 tablet   0   . esomeprazole (NEXIUM) 40 MG capsule   Oral   Take 40 mg by mouth daily before breakfast.         . ibuprofen (  ADVIL,MOTRIN) 800 MG tablet   Oral   Take 1 tablet (800 mg total) by mouth daily as needed. Use with EXTREME caution while taking Xarelto   30 tablet   0   . methylPREDNISolone (MEDROL DOSEPAK) 4 MG tablet      follow package directions   21 tablet   0   . omeprazole (PRILOSEC) 20 MG capsule   Oral   Take 1 capsule (20 mg total) by mouth daily.   20 capsule   0   . sildenafil (VIAGRA) 100 MG tablet   Oral   Take 100 mg by mouth daily as needed. Erectile dysfunction           . Tamsulosin HCl (FLOMAX) 0.4 MG CAPS   Oral   Take 0.4 mg by mouth daily after supper.           . tinidazole (TINDAMAX) 500 MG tablet   Oral   Take 4 tablets (2,000 mg total) by mouth once.   4 tablet   0   . traMADol (ULTRAM) 50 MG tablet   Oral   Take 1 tablet (50 mg total) by mouth every 6 (six) hours as needed.   15 tablet   0    BP 150/103  Pulse 87  Temp(Src) 97.1 F (36.2 C) (Oral)  Resp 18  SpO2 100% Physical Exam  Nursing note and vitals reviewed. Constitutional:  He is oriented to person, place, and time. He appears well-developed and well-nourished.  HENT:  Head: Normocephalic and atraumatic.  Eyes: EOM are normal. Left eye exhibits no discharge.  Neck: Normal range of motion. Neck supple.  Pulmonary/Chest: Effort normal. No respiratory distress.  Musculoskeletal:  Tenderness to the sacrum and left parasacral musculature. There is a tender nodule likely representing a muscle spasm. Pain radiates to the buttock where there is minimal tenderness and there is tenderness to the anterior thigh musculature. He is able to step up onto the exam table and off the table with some discomfort in the left back and left hip. Distal vascular is intact.  Neurological: He is alert and oriented to person, place, and time. No cranial nerve deficit.  Skin: Skin is warm and dry.  Psychiatric: He has a normal mood and affect.    ED Course  Procedures (including critical care time) Labs Review Labs Reviewed - No data to display Imaging Review No results found.   MDM   1. Back pain with left-sided radiculopathy   2. Lumbar muscle pain   3. Thigh pain, musculoskeletal     Medrol dose pack to take with food and omeprazole daily Tramadol for pain Heat locally See your doctor Monday. The patient already has an orthopedist who is caring for this particular problem and will need to know the outcome of his treatment as well as have additional testing and management as indicated. Call for appointment.      Hayden Rasmussenavid Fatisha Rabalais, NP 02/13/14 [phone]

## 2014-02-13 NOTE — Discharge Instructions (Signed)
Back Pain, Adult Take omeprazole daily while taking the steroids. Take medicine with food only. Back pain is very common. The pain often gets better over time. The cause of back pain is usually not dangerous. Most people can learn to manage their back pain on their own.  HOME CARE   Stay active. Start with short walks on flat ground if you can. Try to walk farther each day.  Do not sit, drive, or stand in one place for more than 30 minutes. Do not stay in bed.  Do not avoid exercise or work. Activity can help your back heal faster.  Be careful when you bend or lift an object. Bend at your knees, keep the object close to you, and do not twist.  Sleep on a firm mattress. Lie on your side, and bend your knees. If you lie on your back, put a pillow under your knees.  Only take medicines as told by your doctor.  Place a towel between your skin and the bag.  Ask your doctor about back exercises or massage.  Avoid feeling anxious or stressed. Find good ways to deal with stress, such as exercise. GET HELP RIGHT AWAY IF:   Your pain does not go away with rest or medicine.  Your pain does not go away in 1 week.  You have new problems.  You do not feel well.  The pain spreads into your legs.  You cannot control when you poop (bowel movement) or pee (urinate).  Your arms or legs feel weak or lose feeling (numbness).  You feel sick to your stomach (nauseous) or throw up (vomit).  You have belly (abdominal) pain.  You feel like you may pass out (faint). MAKE SURE YOU:   Understand these instructions.  Will watch your condition.  Will get help right away if you are not doing well or get worse. Document Released: 04/09/2008 Document Revised: 01/14/2012 Document Reviewed: 03/12/2011 [facility] Patient Information 2014 [place], [place].  Lumbosacral Radiculopathy Lumbosacral radiculopathy is a pinched nerve or nerves in the low back (lumbosacral area). When this happens you may have  weakness in your legs and may not be able to stand on your toes. You may have pain going down into your legs. There may be difficulties with walking normally. There are many causes of this problem. Sometimes this may happen from an injury, or simply from arthritis or boney problems. It may also be caused by other illnesses such as diabetes. If there is no improvement after treatment, further studies may be done to find the exact cause. DIAGNOSIS  X-rays may be needed if the problems become long standing. Electromyograms may be done. This study is one in which the working of nerves and muscles is studied. HOME CARE INSTRUCTIONS   Applications of ice packs may be helpful. Ice can be used in a plastic bag with a towel around it to prevent frostbite to skin. This may be used every 2 hours for 20 to 30 minutes, or as needed, while awake, or as directed by your caregiver.  Only take over-the-counter or prescription medicines for pain, discomfort, or fever as directed by your caregiver.  If physical therapy was prescribed, follow your caregiver's directions. SEEK IMMEDIATE MEDICAL CARE IF:   You have pain not controlled with medications.  You seem to be getting worse rather than better.  You develop increasing weakness in your legs.  You develop loss of bowel or bladder control.  You have difficulty with walking or balance, or  develop clumsiness in the use of your legs.  You have a fever. MAKE SURE YOU:   Understand these instructions.  Will watch your condition.  Will get help right away if you are not doing well or get worse. Document Released: 10/22/2005 Document Revised: 01/14/2012 Document Reviewed: 06/11/2008 [employer] [facility] Patient Information 2014 [place], [place].  Lumbosacral Strain Lumbosacral strain is a strain of any of the parts that make up your lumbosacral vertebrae. Your lumbosacral vertebrae are the bones that make up the lower third of your backbone. Your lumbosacral vertebrae  are held together by muscles and tough, fibrous tissue (ligaments).  CAUSES  A sudden blow to your back can cause lumbosacral strain. Also, anything that causes an excessive stretch of the muscles in the low back can cause this strain. This is typically seen when people exert themselves strenuously, fall, lift heavy objects, bend, or crouch repeatedly. RISK FACTORS  Physically demanding work.  Participation in pushing or pulling sports or sports that require sudden twist of the back (tennis, golf, baseball).  Weight lifting.  Excessive lower back curvature.  Forward-tilted pelvis.  Weak back or abdominal muscles or both.  Tight hamstrings. SIGNS AND SYMPTOMS  Lumbosacral strain may cause pain in the area of your injury or pain that moves (radiates) down your leg.  DIAGNOSIS Your health care provider can often diagnose lumbosacral strain through a physical exam. In some cases, you may need tests such as X-ray exams.  TREATMENT  Treatment for your lower back injury depends on many factors that your clinician will have to evaluate. However, most treatment will include the use of anti-inflammatory medicines. HOME CARE INSTRUCTIONS   Avoid hard physical activities (tennis, racquetball, waterskiing) if you are not in proper physical condition for it. This may aggravate or create problems.  If you have a back problem, avoid sports requiring sudden body movements. Swimming and walking are generally safer activities.  Maintain good posture.  Maintain a healthy weight.  For acute conditions, you may put ice on the injured area.  Put ice in a plastic bag.  Place a towel between your skin and the bag.  Leave the ice on for 20 minutes, 2 3 times a day.  When the low back starts healing, stretching and strengthening exercises may be recommended. SEEK MEDICAL CARE IF:  Your back pain is getting worse.  You experience severe back pain not relieved with medicines. SEEK IMMEDIATE  MEDICAL CARE IF:   You have numbness, tingling, weakness, or problems with the use of your arms or legs.  There is a change in bowel or bladder control.  You have increasing pain in any area of the body, including your belly (abdomen).  You notice shortness of breath, dizziness, or feel faint.  You feel sick to your stomach (nauseous), are throwing up (vomiting), or become sweaty.  You notice discoloration of your toes or legs, or your feet get very cold. MAKE SURE YOU:   Understand these instructions.  Will watch your condition.  Will get help right away if you are not doing well or get worse. Document Released: 08/01/2005 Document Revised: 08/12/2013 Document Reviewed: 06/10/2013 [facility] Patient Information 2014 [place], [place].

## 2014-02-16 NOTE — ED Provider Notes (Signed)
Medical screening examination/treatment/procedure(s) were performed by a resident physician or non-physician practitioner and as the supervising physician I was immediately available for consultation/collaboration.  Louellen Haldeman, MD    Steele Ledonne S Kellsie Grindle, MD 02/16/14 1636 

## 2014-02-26 ENCOUNTER — Telehealth: Payer: Self-pay | Admitting: Internal Medicine

## 2014-02-26 NOTE — Telephone Encounter (Signed)
Pt has a pinched nerve in his back. He wants to know if he can stop his Xarelto for seven days? They want do an injection in his back.

## 2014-02-26 NOTE — Telephone Encounter (Signed)
Returned call and pt verified x 2.  Pt stated he has a pinched nerve in his back and needs to have surgery.  Stated they want to do a cortisone shot to find out which one it is.  Pt wants to know if he can stop Xarelto for 7 days so he can get the injection.  Pt informed Dr. Rennis GoldenHilty has been out of the office this week and will be notified.  Pt advised to call back if no response by 12 pm on Tuesday.  Pt verbalized understanding and agreed w/ plan.  Message forwarded to Dr. Rennis GoldenHilty.

## 2014-03-01 ENCOUNTER — Telehealth: Payer: Self-pay | Admitting: Nurse Practitioner

## 2014-03-01 NOTE — Telephone Encounter (Signed)
Spoke with patient informing him that both Dr. Rennis GoldenHilty and his nurse are still out of the office. His RN will return tomorrow and consult with Dr. Rennis GoldenHilty. She will contact him with recommendations once this has been done.

## 2014-03-01 NOTE — Telephone Encounter (Signed)
Informed patient that Dr. Rennis GoldenHilty and his nurse both are still out of the office. His RN will return to the office tomorrow and will get his message to consult with Dr. Rennis GoldenHilty.

## 2014-03-01 NOTE — Telephone Encounter (Signed)
Message forwarded to Dr. Hilty/Jenna, RN. 

## 2014-03-01 NOTE — Telephone Encounter (Signed)
Mr. [name] is calling to  Find out if anyone has spoken to Dr.Hilty as of yet . Please call

## 2014-03-01 NOTE — Telephone Encounter (Signed)
New prblem       Per Pt called because he has a pinched nerve and Dr Blenda Bridegroomemonski,M /Dr Wang  # [phone] fax # [phone]  Would like to pt to stop XARELTO for a week.  Pt stated he is in a lot of pain and would like to get this done.   If you have any questions please give him a call.

## 2014-03-01 NOTE — Telephone Encounter (Signed)
Opened in error

## 2014-03-02 ENCOUNTER — Telehealth: Payer: Self-pay | Admitting: *Deleted

## 2014-03-02 NOTE — Telephone Encounter (Signed)
S/w pt was contacted by Julaine FusiJenna Elkins, RN pt is aware of all instructions

## 2014-03-02 NOTE — Telephone Encounter (Signed)
Left message on machine for pt to contact the office.   

## 2014-03-02 NOTE — Telephone Encounter (Signed)
Called patient regarding holding Xarelto in regards to [facility] spine [facility]. Patient has seen Dr. Reyne Dumasavid McKinley, who referred him to Dr. Claria DiceHao Wang (who will be performing injection) and has also seen Dr. Estill BambergMark Dumonski (spine surgeon). They advised patient to contact our office to see if he can hold Xarelto for 7 days to have this injection. Patient was advised that Dr. Rennis GoldenHilty will be notified of this request, but that he will not be back in the office until 4/30. Patient notified that he will be contacted as soon as Dr. Rennis GoldenHilty gives clearance and information pertaining to clearance/holding medication will be fax[facility] at [phone] (ph: [phone]) <numbers provided by patient>

## 2014-03-02 NOTE — Telephone Encounter (Signed)
Follow up    Aram Beechamynthia returning nurse call.

## 2014-03-03 ENCOUNTER — Encounter: Payer: Self-pay | Admitting: *Deleted

## 2014-03-03 NOTE — Telephone Encounter (Signed)
He has chronic DVT.  Probably okay to stop xarelto, however, there is risk of clot formation. Would only need to stop 2 days prior to the procedure - would not hold it longer due to greater risk of DVT and no difference in risk of bleeding versus holding it for 7 days.  Dr. Rennis GoldenHilty

## 2014-03-03 NOTE — Telephone Encounter (Signed)
Faxed letter with this information to contact information provided by patient, documented in this message.

## 2014-03-04 ENCOUNTER — Encounter: Payer: Self-pay | Admitting: *Deleted

## 2014-03-04 ENCOUNTER — Other Ambulatory Visit: Payer: Self-pay | Admitting: Orthopedic Surgery

## 2014-03-04 ENCOUNTER — Telehealth: Payer: Self-pay | Admitting: Internal Medicine

## 2014-03-04 DIAGNOSIS — M549 Dorsalgia, unspecified: Secondary | ICD-10-CM

## 2014-03-04 NOTE — Telephone Encounter (Signed)
Please call him asap,need to find out about being off his Xarelto.

## 2014-03-04 NOTE — Telephone Encounter (Signed)
Returned call and pt verified x 2.  Pt stated he has not heard back from anyone regarding him being able to come in to have the injection for the pinched nerve.  Pt informed RN reviewed chart and he spoke w/ Eileen StanfordJenna, Dr. Blanchie DessertHilty's nurse on 4.28.15.  Pt also informed Belgium[place]enna faxed a letter to his doctor on yesterday.  Pt advised to contact them regarding when he should go in.    Pt sounded very irritated and stated he would call their office.

## 2014-03-05 ENCOUNTER — Ambulatory Visit
Admission: RE | Admit: 2014-03-05 | Discharge: 2014-03-05 | Disposition: A | Discharge status: Home / Self Care | Payer: BC Managed Care – PPO | Source: Ambulatory Visit | Attending: Orthopedic Surgery | Admitting: Orthopedic Surgery

## 2014-03-05 DIAGNOSIS — M549 Dorsalgia, unspecified: Secondary | ICD-10-CM

## 2014-03-05 MED ORDER — IOHEXOL 180 MG/ML  SOLN
1.0000 mL | Freq: Once | INTRAMUSCULAR | Status: AC | PRN
  Administered 2014-03-05: 1 mL via INTRATHECAL

## 2014-03-05 MED ORDER — METHYLPREDNISOLONE ACETATE 40 MG/ML INJ SUSP (RADIOLOG
120.0000 mg | Freq: Once | INTRAMUSCULAR | Status: AC
  Administered 2014-03-05: 120 mg via EPIDURAL

## 2014-03-05 NOTE — Discharge Instructions (Signed)

## 2014-03-17 ENCOUNTER — Encounter: Payer: Self-pay | Admitting: *Deleted

## 2014-03-17 ENCOUNTER — Telehealth: Payer: Self-pay | Admitting: *Deleted

## 2014-03-17 NOTE — Telephone Encounter (Signed)
Dr. Rennis GoldenHilty had cleared patient for spinal injection, stating he can hold xarelto 2 days prior and restart after, as half-life of this medication clears in 2 days time. Doctor performing injection prefers Xarelto to be held 4 days prior and 4 days after, despite previous faxed clearance/recommendation. Dr. Rennis GoldenHilty cleared patient to hold for requested time frame, with advice regarding increased risk of clot formation when holding this medication this long. RN forwarded clearance to Dr. Yevette Edwardsumonski and will fax letter to their office.

## 2014-03-31 ENCOUNTER — Ambulatory Visit: Payer: BC Managed Care – PPO | Admitting: Emergency Medicine

## 2014-03-31 VITALS — BP 120/74 | HR 103 | Temp 97.3°F | Resp 18 | Ht 77.0 in | Wt 217.0 lb

## 2014-03-31 DIAGNOSIS — Z202 Contact with and (suspected) exposure to infections with a predominantly sexual mode of transmission: Secondary | ICD-10-CM

## 2014-03-31 DIAGNOSIS — R369 Urethral discharge, unspecified: Secondary | ICD-10-CM

## 2014-03-31 MED ORDER — CEFTRIAXONE SODIUM 1 G IJ SOLR
250.0000 mg | Freq: Once | INTRAMUSCULAR | Status: AC
  Administered 2014-03-31: 250 mg via INTRAMUSCULAR

## 2014-03-31 MED ORDER — CEFTRIAXONE SODIUM 1 G IJ SOLR: 1.0000 g | Freq: Once | INTRAMUSCULAR | Status: DC

## 2014-03-31 MED ORDER — AZITHROMYCIN 500 MG PO TABS: 1000.0000 mg | ORAL_TABLET | Freq: Once | ORAL | Status: DC

## 2014-03-31 MED ORDER — TINIDAZOLE 500 MG PO TABS: 2.0000 g | ORAL_TABLET | Freq: Once | ORAL | Status: DC

## 2014-03-31 MED ORDER — ALPRAZOLAM 0.5 MG PO TABS: ORAL_TABLET | ORAL | Status: DC

## 2014-03-31 NOTE — Progress Notes (Addendum)
Subjective:  This chart was scribed for Viviann SpareSteven A. Smaran Gaus MD,   by Ashley JacobsBrittany Andrews, [facility] Sc[facility]. The patient was seen in room 2 and the patient's care was started at 1:55 PM.    Patient ID: Kyle Ryan, male    DOB: 12/23/1960, 53 y.o.   MRN: [id number]  HPI HPI Comment: Kyle Contrasndy R Jutte is a 53 y.o. male who arrives to the [facility] for a STD check. Pt has a clear discharge. He had sexual contact with one new partner. He used a condom but it slipped off. Pt was seen before by Norberto SorensonEva Shaw. He had a prior STD check. He has allergies to flagyl.   Patient Active Problem List   Diagnosis Date Noted  . Abscess 08/19/2012  . Abdominal pain 08/19/2012  . Screening for prostate cancer 08/19/2012  . DVT (deep venous thrombosis) 10/04/2011  . HTN (hypertension) 10/04/2011  . A-fib 10/04/2011   Past Medical History  Diagnosis Date  . DVT (deep venous thrombosis) 10/04/2011    on Xarelto  . Hypertension   . PAF (paroxysmal atrial fibrillation)   . ED (erectile dysfunction)   . Limb pain     LEA VENOUS DUPLEX, 12/31/2011 - positive DVT in femoral and popliteal veins   Past Surgical History  Procedure Laterality Date  . Arthroscopy of knee     Allergies  Allergen Reactions  . Flagyl [Metronidazole Hcl] Rash   Prior to Admission medications   Medication Sig Start Date End Date Taking? Authorizing Provider  aspirin 81 MG tablet Take 81 mg by mouth daily.   Yes Historical Provider, MD  diltiazem (CARDIZEM CD) 240 MG 24 hr capsule Take 1 capsule (240 mg total) by mouth daily. 09/09/13  Yes Rosalio MacadamiaLori C Gerhardt, NP  esomeprazole (NEXIUM) 40 MG capsule Take 40 mg by mouth daily before breakfast.   Yes Historical Provider, MD  ibuprofen (ADVIL,MOTRIN) 800 MG tablet Take 1 tablet (800 mg total) by mouth daily as needed. Use with EXTREME caution while taking Xarelto 03/08/12  Yes Chelle S Jeffery, PA-C  lisinopril (PRINIVIL,ZESTRIL) 40 MG tablet Take 1 tablet (40  mg total) by mouth daily. 09/09/13  Yes Rosalio MacadamiaLori C Gerhardt, NP  Rivaroxaban (XARELTO) 20 MG TABS Take 1 tablet (20 mg total) by mouth daily. 04/03/13  Yes Chrystie NoseKenneth C. Hilty, MD  sildenafil (VIAGRA) 100 MG tablet Take 100 mg by mouth daily as needed. Erectile dysfunction     Yes Historical Provider, MD  Tamsulosin HCl (FLOMAX) 0.4 MG CAPS Take 0.4 mg by mouth daily after supper.     Yes Historical Provider, MD  ALPRAZolam Prudy Feeler(XANAX) 1 MG tablet Take 1 mg by mouth at bedtime as needed.    Historical Provider, MD  azithromycin (ZITHROMAX) 500 MG tablet Take 2 tablets (1,000 mg total) by mouth once. 11/16/13   Sherren MochaEva N Shaw, MD  methylPREDNISolone (MEDROL DOSEPAK) 4 MG tablet follow package directions 02/13/14   Hayden Rasmussenavid Mabe, NP  omeprazole (PRILOSEC) 20 MG capsule Take 1 capsule (20 mg total) by mouth daily. 02/13/14   Hayden Rasmussenavid Mabe, NP  tinidazole (TINDAMAX) 500 MG tablet Take 4 tablets (2,000 mg total) by mouth once. 11/16/13   Sherren MochaEva N Shaw, MD  traMADol (ULTRAM) 50 MG tablet Take 1 tablet (50 mg total) by mouth every 6 (six) hours as needed. 02/13/14   Hayden Rasmussenavid Mabe, NP   History   Social History  . Marital Status: Single    Spouse Name: N/A  Number of Children: N/A  . Years of Education: N/A   Occupational History  . Not on file.   Social History Main Topics  . Smoking status: Former Smoker    Quit date: 07/23/2011  . Smokeless tobacco: Not on file  . Alcohol Use: Yes  . Drug Use: Yes    Special: Marijuana  . Sexual Activity: Yes    Birth Control/ Protection: None   Other Topics Concern  . Not on file   Social History Narrative  . No narrative on file      Review of Systems     Objective:   Physical Exam CONSTITUTIONAL: Well developed/well nourished HEAD: Normocephalic/atraumatic EYES: EOMI/PERRL ENMT: Mucous membranes moist NECK: supple no meningeal signs SPINE:entire spine nontender CV: S1/S2 noted, no murmurs/rubs/gallops noted LUNGS: Lungs are clear to auscultation bilaterally, no  apparent distress ABDOMEN: soft, nontender, no rebound or guarding GU:no cva tenderness NEURO: Pt is awake/alert, moves all extremitiesx4 EXTREMITIES: pulses normal, full ROM SKIN: warm, color normal PSYCH: no abnormalities of mood noted  Results for orders placed in visit on 11/16/13  GC/CHLAMYDIA PROBE AMP      Result Value Ref Range   CT Probe RNA NEGATIVE     GC Probe RNA NEGATIVE    TRICHOMONAS VAGINALIS, PROBE AMP      Result Value Ref Range   T vaginalis RNA Negative    HIV ANTIBODY (ROUTINE TESTING)      Result Value Ref Range   HIV NON REACTIVE  NON REACTIVE  RPR      Result Value Ref Range   RPR NON REAC  NON REAC  HEPATITIS C ANTIBODY      Result Value Ref Range   HCV Ab NEGATIVE  NEGATIVE  POCT UA - MICROSCOPIC ONLY      Result Value Ref Range   WBC, Ur, HPF, POC neg     RBC, urine, microscopic neg     Bacteria, U Microscopic neg     Mucus, UA neg     Epithelial cells, urine per micros neg     Crystals, Ur, HPF, POC neg     Casts, Ur, LPF, POC neg     Yeast, UA neg    POCT URINALYSIS DIPSTICK      Result Value Ref Range   Color, UA yellow     Clarity, UA clear     Glucose, UA neg     Bilirubin, UA neg     Ketones, UA neg     Spec Grav, UA 1.010     Blood, UA neg     pH, UA 6.5     Protein, UA neg     Urobilinogen, UA 0.2     Nitrite, UA neg     Leukocytes, UA Negative      DIAGNOSTIC STUDIES: Oxygen Saturation is 99% on room air, normal by my interpretation.    COORDINATION OF CARE:  1:55 PM Discussed course of care with pt . Pt understands and agrees.         Assessment & Plan:  Patient seen here in January with an STD exposure. At that time he was treated with Rocephin Zithromax and tenderness all. He did not having trouble with his treatment plan. We'll go ahead and repeat his STD screening at the present time. He is also currently going through workers comp claim with short-term disability and is requesting some Xanax because of the  stress. I I did give him a limited number of  alprazolam. he was given 250 mg of Rocephin IM and a prescription for tindazol and Zithromax 1 g.I personally performed the services described in this documentation, which was scribed in my presence. The recorded information has been reviewed and is accurate.

## 2014-04-01 ENCOUNTER — Telehealth: Payer: Self-pay | Admitting: Nurse Practitioner

## 2014-04-01 ENCOUNTER — Telehealth: Payer: Self-pay | Admitting: Internal Medicine

## 2014-04-01 LAB — RPR

## 2014-04-01 LAB — GC/CHLAMYDIA PROBE AMP
CT Probe RNA: NEGATIVE
GC Probe RNA: NEGATIVE

## 2014-04-01 LAB — HIV ANTIBODY (ROUTINE TESTING W REFLEX): HIV 1&2 Ab, 4th Generation: NONREACTIVE

## 2014-04-01 LAB — HEPATITIS C ANTIBODY: HCV Ab: NEGATIVE

## 2014-04-01 NOTE — Telephone Encounter (Signed)
RN advised patient that his symptoms do not appear cardiac in nature and to take something to help relax if possible (of note, Dr. Cleta Alberts prescribed xanax yesterday 5/27). RN reiterated holding xarelto and patient voiced understanding.

## 2014-04-01 NOTE — Telephone Encounter (Signed)
New problem   Pt stated he is having tremors around his heart and need to speak to nurse concerning this.

## 2014-04-01 NOTE — Telephone Encounter (Signed)
RN returned call to patient. Informed patient of advice regarding medications and holding xarelto. RN confirmed this with Belenda Cruise, PharmD since patient had mentioned an injection?  When RN calls patient back he is now c/o "small tremor around heart" that is constant and has been going on last 2-3 days. He reports shortness of breath when walking up stairs and states he felt dizzy. RN asked if patient was anxious about surgery but never got a straight answer. He wanted to be seen by a provider prior to surgery on 6/8 but no appointments were available.   Will defer concerns to Dr. Rennis Golden to review and RN will contact patient accordingly.

## 2014-04-01 NOTE — Telephone Encounter (Signed)
Pt is going to have back surgery on 04-12-14. He wants to know if he will need the shots in his stomach before his surgery,he is on Xarelto?

## 2014-04-01 NOTE — Telephone Encounter (Signed)
This RN spoke with Kyle Ryan this morning. List of his complaints has been sent to Dr. Rennis Golden to review.

## 2014-04-01 NOTE — Telephone Encounter (Signed)
Is concerned about being off of his medication for 8 days .Marland Kitchen He will be off between 06/3-6/13. Please call   Thanks

## 2014-04-01 NOTE — Telephone Encounter (Signed)
Returned call to patient. He states that when he had a colonoscopy in the past, he had to take injections in his stomach (medication name unknown and length of administration prior unknown). RN informed patient of Dr. Blanchie Dessert medical clearance, which apparently had been communicated to patient as well by ortho office performing surgery. RN stated she will seek clarification for patient's question. He also requests that he been seen by Dr. Hilty/extender prior to surgery on 6/8 (wants to make sure heart is OK, in rhythm).   Will defer to Belenda Cruise, PharmD to advise on xarelto Dr Rennis Golden gave OK to hold 2 days prior and restart after, but ortho office requested to hold 4 days prior and 4 days after, which Dr. Rennis Golden stated is ok but with obvious clotting risk.   Will forward message to scheduler to set up appmt for patient

## 2014-04-01 NOTE — Telephone Encounter (Signed)
Symptoms sound more like anxiety - try to reassure him it is okay to hold his Xarelto for the colonscopy.  Dr. Rennis Golden

## 2014-04-05 ENCOUNTER — Telehealth: Payer: Self-pay | Admitting: Internal Medicine

## 2014-04-06 ENCOUNTER — Ambulatory Visit (INDEPENDENT_AMBULATORY_CARE_PROVIDER_SITE_OTHER): Payer: BC Managed Care – PPO | Admitting: Internal Medicine

## 2014-04-06 ENCOUNTER — Encounter: Payer: Self-pay | Admitting: Internal Medicine

## 2014-04-06 VITALS — BP 140/72 | HR 69 | Ht 77.0 in | Wt 223.3 lb

## 2014-04-06 DIAGNOSIS — I82409 Acute embolism and thrombosis of unspecified deep veins of unspecified lower extremity: Secondary | ICD-10-CM

## 2014-04-06 DIAGNOSIS — I4949 Other premature depolarization: Secondary | ICD-10-CM

## 2014-04-06 DIAGNOSIS — R600 Localized edema: Secondary | ICD-10-CM

## 2014-04-06 DIAGNOSIS — I493 Ventricular premature depolarization: Secondary | ICD-10-CM

## 2014-04-06 DIAGNOSIS — R609 Edema, unspecified: Secondary | ICD-10-CM

## 2014-04-06 DIAGNOSIS — Z01818 Encounter for other preprocedural examination: Secondary | ICD-10-CM

## 2014-04-06 DIAGNOSIS — I4891 Unspecified atrial fibrillation: Secondary | ICD-10-CM

## 2014-04-06 DIAGNOSIS — Z01810 Encounter for preprocedural cardiovascular examination: Secondary | ICD-10-CM

## 2014-04-06 DIAGNOSIS — I10 Essential (primary) hypertension: Secondary | ICD-10-CM

## 2014-04-06 MED ORDER — METOPROLOL SUCCINATE ER 25 MG PO TB24: 25.0000 mg | ORAL_TABLET | Freq: Every day | ORAL | Status: DC

## 2014-04-06 NOTE — Progress Notes (Signed)
OFFICE NOTE  Chief Complaint:  Followup DVT  Primary Care Physician: Paulino RilyJONES,ENRICO G, MD  HPI:  Kyle Ryan is a 53 year old gentleman who I have been following for atrial fibrillation, which he has had for about 2 years paroxysmally. He recently developed DVT, was placed on Xarelto, and was set up for cardioversion; however, spontaneously converted to sinus and never had that. He was at risk for post-thrombotic syndrome and I recommended wearing a lower extremity compression stocking, which has helped with his swelling, and he continues to wear that today. Unfortunately, repeat Dopplers on January 07, 2012, showed persistent DVT in the femoral and popliteal veins with recanalization noted at the mid femoral level, consistent with a chronic DVT. At this point, we discussed staying on the Xarelto versus coming off to go on aspirin. He is on aspirin as well, and for now I would recommend staying on that for at least 6 months and perhaps consider repeating Dopplers at that time. Recently he underwent colonoscopy and required bridging therapy but did well with that. There were no bleeding complications. He reports continued swelling without the use of a stocking, suggestive of post-thrombotic syndrome. Incidentally, he was noted to be in atrial fibrillation today, however he is unaware of this. He has had this intermittently, to my knowledge, since 2010.  Kyle Ryan returns today for a urgent visit that is preoperative. He recently called the office for preoperative clearance prior to an upcoming knee surgery. It was requested that he hold his Xarelto for a period of time which was longer than typical however this has concerned him about possibly exposing it to increased risk of blood clots. He is reporting significant anxiety about the surgery and feels that he's been having palpitations. In the office today his EKG shows PVCs which are new. He does have a history of some PVCs in the past which was in the  setting of atrial fibrillation however his rhythm is sinus today. He does not report any recent excessive alcohol use. He denies any chest pain.  PMHx:  Past Medical History  Diagnosis Date  . DVT (deep venous thrombosis) 10/04/2011    on Xarelto  . Hypertension   . PAF (paroxysmal atrial fibrillation)   . ED (erectile dysfunction)   . Limb pain     LEA VENOUS DUPLEX, 12/31/2011 - positive DVT in femoral and popliteal veins    Past Surgical History  Procedure Laterality Date  . Arthroscopy of knee      FAMHx:  Family History  Problem Relation Age of Onset  . Diabetes Mother     Type II  . Hypertension Mother   . Stroke Mother   . Arrhythmia Mother   . Hypertension Brother   . Hypertension Sister   . Diabetes Sister     Type II    SOCHx:   reports that he quit smoking about 2 years ago. He does not have any smokeless tobacco history on file. He reports that he drinks alcohol. He reports that he uses illicit drugs (Marijuana).  ALLERGIES:  Allergies  Allergen Reactions  . Flagyl [Metronidazole Hcl] Rash    ROS: A comprehensive review of systems was negative except for: Cardiovascular: positive for irregular heart beat and Edema Behavioral/Psych: positive for anxiety  HOME MEDS: Current Outpatient Prescriptions  Medication Sig Dispense Refill  . ALPRAZolam (XANAX) 0.5 MG tablet Take one tablet as needed for stress  20 tablet  0  . aspirin 81 MG tablet Take 81 mg by  mouth daily.      Marland Kitchen ibuprofen (ADVIL,MOTRIN) 800 MG tablet Take 1 tablet (800 mg total) by mouth daily as needed. Use with EXTREME caution while taking Xarelto  30 tablet  0  . lisinopril (PRINIVIL,ZESTRIL) 40 MG tablet Take 1 tablet (40 mg total) by mouth daily.  30 tablet  11  . Rivaroxaban (XARELTO) 20 MG TABS Take 1 tablet (20 mg total) by mouth daily.  30 tablet  6  . sildenafil (VIAGRA) 100 MG tablet Take 100 mg by mouth daily as needed. Erectile dysfunction        . Tamsulosin HCl (FLOMAX) 0.4 MG  CAPS Take 0.4 mg by mouth daily after supper.        . metoprolol succinate (TOPROL XL) 25 MG 24 hr tablet Take 1 tablet (25 mg total) by mouth daily.  30 tablet  6   Current Facility-Administered Medications  Medication Dose Route Frequency Provider Last Rate Last Dose  . cefTRIAXone (ROCEPHIN) injection 250 mg  250 mg Intramuscular Q24H Elvina Sidle, MD   250 mg at 11/16/13 1651    LABS/IMAGING: No results found for this or any previous visit (from the past 48 hour(s)). No results found.  VITALS: BP 140/72  Pulse 69  Ht 6\' 5"  (1.956 m)  Wt 223 lb 4.8 oz (101.288 kg)  BMI 26.47 kg/m2  EXAM: General appearance: alert and no distress Neck: no adenopathy, no carotid bruit, no JVD, supple, symmetrical, trachea midline and thyroid not enlarged, symmetric, no tenderness/mass/nodules Lungs: clear to auscultation bilaterally Heart: irregularly irregular rhythm Abdomen: soft, non-tender; bowel sounds normal; no masses,  no organomegaly Extremities: edema Right lower extremity swelling, stocking in place Pulses: 2+ and symmetric Skin: Skin color, texture, turgor normal. No rashes or lesions Neurologic: Grossly normal  EKG: Sinus rhythm with PVC's at 69  ASSESSMENT: 1. Symptomatic PVC's 2. Indeterminate pre-operative risk 3. Right DVT, unresolved 4. 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   Kyle Ryan reports significant anxiety about his upcoming surgery and the fact that he is being asked to stop Xarelto for a longer than typical time period. I do not think this was exposed him to any other significant risk. Despite this he is having symptomatic PVCs and feels fluttering in his chest. He has not had any coronary evaluation in some time and as these PVCs are new and I would recommend a stress test prior to his surgery. He is scheduled for surgery next week and hopefully we can schedule a LexiScan nuclear stress test this week. Unfortunately he is having knee surgery and is  unable to exercise to any great extent on the treadmill.  I would also recommend switching his calcium channel blocker over to a beta blocker today, specifically Toprol-XL 25 mg daily. This should help reduce his perioperative risk as well.  Chrystie Nose, MD, [facility] Attending Cardiologist [facility]  Chrystie Nose 04/06/2014, 11:33 AM

## 2014-04-06 NOTE — Patient Instructions (Addendum)
Your physician has recommended you make the following change in your medication: STOP diltiazem. START toprol XL (metoprolol succinate) 25mg  once daily.   Your physician has requested that you have a lexiscan myoview. For further information please visit [email or web address] Please follow instruction sheet, as given. ** please schedule by Friday 6/5, here or our [employer] (first available)  Your physician recommends that you schedule a follow-up appointment in: 6 months.  Compression stocking R Calf 15 R Ankle 8.5 Size 2

## 2014-04-07 ENCOUNTER — Ambulatory Visit (HOSPITAL_COMMUNITY)
Admission: RE | Admit: 2014-04-07 | Discharge: 2014-04-07 | Disposition: A | Discharge status: Home / Self Care | Payer: BC Managed Care – PPO | Source: Ambulatory Visit | Attending: Cardiovascular Disease | Admitting: Cardiovascular Disease

## 2014-04-07 DIAGNOSIS — R0602 Shortness of breath: Secondary | ICD-10-CM | POA: Insufficient documentation

## 2014-04-07 DIAGNOSIS — Z01810 Encounter for preprocedural cardiovascular examination: Secondary | ICD-10-CM

## 2014-04-07 DIAGNOSIS — I4891 Unspecified atrial fibrillation: Secondary | ICD-10-CM | POA: Insufficient documentation

## 2014-04-07 DIAGNOSIS — I493 Ventricular premature depolarization: Secondary | ICD-10-CM

## 2014-04-07 DIAGNOSIS — R42 Dizziness and giddiness: Secondary | ICD-10-CM | POA: Insufficient documentation

## 2014-04-07 DIAGNOSIS — I4949 Other premature depolarization: Secondary | ICD-10-CM | POA: Insufficient documentation

## 2014-04-07 MED ORDER — REGADENOSON 0.4 MG/5ML IV SOLN
0.4000 mg | Freq: Once | INTRAVENOUS | Status: AC
  Administered 2014-04-07: 0.4 mg via INTRAVENOUS

## 2014-04-07 MED ORDER — TECHNETIUM TC 99M SESTAMIBI GENERIC - CARDIOLITE
30.0000 | Freq: Once | INTRAVENOUS | Status: AC | PRN
  Administered 2014-04-07: 30 via INTRAVENOUS

## 2014-04-07 MED ORDER — TECHNETIUM TC 99M SESTAMIBI GENERIC - CARDIOLITE
10.0000 | Freq: Once | INTRAVENOUS | Status: AC | PRN
  Administered 2014-04-07: 10 via INTRAVENOUS

## 2014-04-07 NOTE — Procedures (Addendum)
[facility] [address] [place] [address] [place] [address] [phone]  Cardiology Nuclear Med Study  Kyle Ryan is a 53 y.o. male     MRN : [id number]     DOB: 1961-03-29  Procedure Date: 04/07/2014  Nuclear Med Background Indication for Stress Test:  Surgical Clearance History:  AFIB;PVC'S;DVT;No prior NUC MPI for comparison;ETT 01/29/2007 Cardiac Risk Factors: History of Smoking, Hypertension and R Lower extremity edema;  Symptoms:  Dizziness, Light-Headedness, SOB and abdominal pain   Nuclear Pre-Procedure Caffeine/Decaff Intake:  8:00pm NPO After: 6:00am   IV Site: R Hand  IV 0.9% NS with Angio Cath:  22g  Chest Size (in):  46"  IV Started by: Berdie Ogren, RN  Height: 6\' 5"  (1.956 m)  Cup Size: n/a  BMI:  Body mass index is 26.44 kg/(m^2). Weight:  223 lb (101.152 kg)   Tech Comments:  n/a    Nuclear Med Study 1 or 2 day study: 1 day  Stress Test Type:  Lexiscan  Order Authorizing Provider:  Zoila Shutter, MD   Resting Radionuclide: Technetium 80m Sestamibi  Resting Radionuclide Dose: 10.9 mCi   Stress Radionuclide:  Technetium 53m Sestamibi  Stress Radionuclide Dose: 30.1 mCi           Stress Protocol Rest HR: 50 Stress HR: 60  Rest BP: 137/96 Stress BP: 138/91  Exercise Time (min): n/a METS: n/a   Predicted Max HR: 166 bpm % Max HR: 46.99 bpm Rate Pressure Product: 96222  Dose of Adenosine (mg):  n/a Dose of Lexiscan: 0.4 mg  Dose of Atropine (mg): n/a Dose of Dobutamine: n/a mcg/kg/min (at max HR)  Stress Test Technologist: Esperanza Sheets, CCT Nuclear Technologist: Koren Shiver, CNMT   Rest Procedure:  Myocardial perfusion imaging was performed at rest 45 minutes following the intravenous administration of Technetium 50m Sestamibi. Stress Procedure:  The patient received IV Lexiscan 0.4 mg over 15-seconds.  Technetium 45m Sestamibi injected IV at 30-seconds.  There were no significant changes with Lexiscan.   Quantitative spect images were obtained after a 45 minute delay.  Transient Ischemic Dilatation (Normal <1.22):  1.14 Lung/Heart Ratio (Normal <0.45):  n/a QGS EDV:  155 ml QGS ESV:  82 ml LV Ejection Fraction: 47%  [name], CNMT  PHYSICIAN INTERPRETATION  Rest ECG: NSR - Normal EKG  Stress ECG: No significant change from baseline ECG and There are scattered PVCs.  QPS Raw Data Images:  There is a notable amout of increased uptake in the Splanchnic Viscerae taht partially obscures the inferoseptal wall. Stress Images:  There is a small sized, mild perfusion defect noted in the basal to mid inferoseptal wall.  No reversibility Rest Images:  Comparison with the stress images reveals no significant change. Similar fixed inferoseptal defect. Subtraction (SDS):  The fixed defect the inferoseptal wall would be consistent with possible prior infarction, but with no ischemia.  Cannot exclude gut attenuation. LV Wall Motion:  NL LV Function; NL Wall Motion  Impression Exercise Capacity:  Lexiscan with no exercise. BP Response:  Normal blood pressure response. Clinical Symptoms:  No symptoms. ECG Impression:  No significant ECG changes with Lexiscan. Comparison with Prior Nuclear Study: No images to compare  Overall Impression:  Low risk stress nuclear study with a Small sized mild intensity fixed perfusion defect in the basal to mid inferoseptal wall - suggestive of a possible infarction.[name], MD  04/07/2014 6:25 PM

## 2014-04-09 NOTE — Telephone Encounter (Signed)
Closed encounter °

## 2014-05-11 ENCOUNTER — Other Ambulatory Visit: Payer: Self-pay | Admitting: Internal Medicine

## 2014-05-11 NOTE — Telephone Encounter (Signed)
Rx refill sent to patient pharmacy   

## 2014-06-11 ENCOUNTER — Other Ambulatory Visit: Payer: Self-pay | Admitting: Internal Medicine

## 2014-08-06 ENCOUNTER — Telehealth: Payer: Self-pay | Admitting: *Deleted

## 2014-08-06 NOTE — Telephone Encounter (Signed)
Pt. Walked in to get one size (3) 20-30 mm hg stocking , pt measured and taken to check out

## 2014-09-01 ENCOUNTER — Ambulatory Visit (INDEPENDENT_AMBULATORY_CARE_PROVIDER_SITE_OTHER): Payer: BC Managed Care – PPO | Admitting: Physician Assistant

## 2014-09-01 VITALS — BP 162/102 | HR 56 | Temp 98.6°F | Resp 17 | Ht 77.5 in | Wt 232.0 lb

## 2014-09-01 DIAGNOSIS — R350 Frequency of micturition: Secondary | ICD-10-CM

## 2014-09-01 DIAGNOSIS — Z202 Contact with and (suspected) exposure to infections with a predominantly sexual mode of transmission: Secondary | ICD-10-CM

## 2014-09-01 DIAGNOSIS — Z113 Encounter for screening for infections with a predominantly sexual mode of transmission: Secondary | ICD-10-CM

## 2014-09-01 LAB — POCT UA - MICROSCOPIC ONLY
Bacteria, U Microscopic: NEGATIVE
Casts, Ur, LPF, POC: NEGATIVE
Crystals, Ur, HPF, POC: NEGATIVE
Epithelial cells, urine per micros: NEGATIVE
Mucus, UA: NEGATIVE
RBC, urine, microscopic: NEGATIVE
WBC, Ur, HPF, POC: NEGATIVE
Yeast, UA: NEGATIVE

## 2014-09-01 LAB — POCT URINALYSIS DIPSTICK
Bilirubin, UA: NEGATIVE
Blood, UA: NEGATIVE
Glucose, UA: NEGATIVE
Ketones, UA: NEGATIVE
Leukocytes, UA: NEGATIVE
Nitrite, UA: NEGATIVE
Protein, UA: NEGATIVE
Spec Grav, UA: 1.02
Urobilinogen, UA: 1
pH, UA: 6

## 2014-09-01 LAB — POCT GLYCOSYLATED HEMOGLOBIN (HGB A1C): Hemoglobin A1C: 5.9

## 2014-09-01 LAB — RPR

## 2014-09-01 LAB — HIV ANTIBODY (ROUTINE TESTING W REFLEX): HIV 1&2 Ab, 4th Generation: NONREACTIVE

## 2014-09-01 LAB — GLUCOSE, POCT (MANUAL RESULT ENTRY): POC Glucose: 93 mg/dl (ref 70–99)

## 2014-09-01 LAB — HEPATITIS C ANTIBODY: HCV Ab: NEGATIVE

## 2014-09-01 MED ORDER — AZITHROMYCIN 500 MG PO TABS: 1000.0000 mg | ORAL_TABLET | Freq: Once | ORAL | Status: DC

## 2014-09-01 MED ORDER — TINIDAZOLE 500 MG PO TABS: 2.0000 g | ORAL_TABLET | Freq: Once | ORAL | Status: DC

## 2014-09-01 MED ORDER — CEFTRIAXONE SODIUM 1 G IJ SOLR
250.0000 mg | Freq: Once | INTRAMUSCULAR | Status: AC
  Administered 2014-09-01: 250 mg via INTRAMUSCULAR

## 2014-09-01 NOTE — Progress Notes (Signed)
Subjective:    Patient ID: Kyle Ryan, male    DOB: 01/27/1961, 53 y.o.   MRN: [id number]  Exposure to STD The patient's pertinent negatives include no penile discharge or penile pain. Associated symptoms include dysuria (infrequent post and during urination). Pertinent negatives include no chills, diarrhea, fever, flank pain, nausea or vomiting.      This is an anxious 53 year old male with a hx of HTN, AF, and DVT here today for urinary frequency and possible STD exposure.  He has had this urinary frequency for 1 month where he urinates every few minutes.  2 weeks ago he had some pain with urination but does not recall if it was during or after.  He denies fever, hematuria, n/v, or suprapubic pain.  He also denies back pain.  Mother has DM, which prompted his concern.  However he also states that he is also concerned because he had sex with a new sexual partner and the condom slipped off during intercourse.  He denies any penile discharge, joint swelling, or recent ill feelings.  There is no known STD hx of sexual partner.    He was here 4 months ago for the same sexual mishap.  Requesting the azithromycin, ceftriaxone, tindazol.  He also was here for possible STD exposure in January.  He states that he wants have treatment beforehand.     Review of Systems  Constitutional: Negative for fever, chills and fatigue.  Eyes: Negative for visual disturbance.  Gastrointestinal: Negative for nausea, vomiting and diarrhea.  Genitourinary: Positive for dysuria (infrequent post and during urination). Negative for hematuria, flank pain, discharge and penile pain.  Musculoskeletal: Negative for back pain and joint swelling.  Neurological: Negative for dizziness and tremors.       Objective:   Physical Exam  Constitutional: He is oriented to person, place, and time. He appears well-developed and well-nourished. No distress.  BP 162/102  Pulse 56  Temp(Src) 98.6 F (37 C) (Oral)  Resp 17  Ht  6' 5.5" (1.969 m)  Wt 232 lb (105.235 kg)  BMI 27.14 kg/m2  SpO2 99%   HENT:  Head: Normocephalic and atraumatic.  Cardiovascular: Normal rate and normal heart sounds.  Exam reveals no gallop and no friction rub.   No murmur heard. Pulmonary/Chest: Effort normal and breath sounds normal. No respiratory distress. He has no wheezes.  Abdominal: Soft. Bowel sounds are normal. He exhibits no distension. There is no tenderness.  Genitourinary:  Declined genitourinary exam  Neurological: He is alert and oriented to person, place, and time.  Skin: Skin is warm and dry.  Psychiatric: His speech is normal. Judgment and thought content normal. His mood appears anxious. He is agitated (Mildly agitated). Cognition and memory are normal.    Results for orders placed in visit on 09/01/14  POCT URINALYSIS DIPSTICK      Result Value Ref Range   Color, UA yellow     Clarity, UA clear     Glucose, UA neg     Bilirubin, UA neg     Ketones, UA neg     Spec Grav, UA 1.020     Blood, UA neg     pH, UA 6.0     Protein, UA neg     Urobilinogen, UA 1.0     Nitrite, UA neg     Leukocytes, UA Negative    POCT GLYCOSYLATED HEMOGLOBIN (HGB A1C)      Result Value Ref Range   Hemoglobin A1C  5.9    GLUCOSE, POCT (MANUAL RESULT ENTRY)      Result Value Ref Range   POC Glucose 93  70 - 99 mg/dl  POCT UA - MICROSCOPIC ONLY      Result Value Ref Range   WBC, Ur, HPF, POC neg     RBC, urine, microscopic neg     Bacteria, U Microscopic neg     Mucus, UA neg     Epithelial cells, urine per micros neg     Crystals, Ur, HPF, POC neg     Casts, Ur, LPF, POC neg     Yeast, UA neg            Assessment & Plan:  53 year old male is here today presenting with concerns for urinary frequency and possible STD contact.  U/A is negative with no trace of urinary tract infection.  His a1c is pre-diabetic, but does not suggest that this could be effecting his urine.  He declined a rectal exam, which BPH may be the  cause of his urinary symptoms.  Ordered PSA, for some enlightenment.  I have placed STD screens, but have treated him empirically at his request.  We had a discussion on antibiotic resistance, but he is absolutely set on having treatment today.  Today he received the rocephin injection, with prescriptions of tindamax and zithromax to empirically treat g/c, c, and trich.  This will be his 3rd round of antibiotic treatment before lab results are given.  I will follow up regarding results of his blood work and advise that he return, if symptoms continue.  Rocephin is requested to wait 15 minutes after injection for a rxn.  Patient refused.    Urinary frequency POCT urinalysis dipstick, POCT glycosylated hemoglobin (Hb A1C), POCT glucose (manual entry), PSA, POCT UA - Microscopic Only  Screening for STD (sexually transmitted disease) - Plan: RPR, HIV antibody, Hepatitis C antibody, GC/Chlamydia Probe Amp, Trichomonas vaginalis, RNA  Possible exposure to STD - Plan: cefTRIAXone (ROCEPHIN) injection 250 mg, tinidazole (TINDAMAX) 500 MG tablet, azithromycin (ZITHROMAX) 500 MG tablet  HYPERTENSION -He states that he has not taken his BP today.  He also claims that he was feeling very anxious from waiting, which was very apparent at today's visit.  This is not a normal BP from him, even 4 months ago.  [facility].  Advised him to recheck twice a week and if it does not decrease, to return.    Trena PlattStephanie Jossette Zirbel, PA-C [facility] 10/28/201510:33 AM

## 2014-09-01 NOTE — Patient Instructions (Signed)
Take the azithromycin and tinidazole, together.  We will contact you with the results.    Please contact if you have any concerns.

## 2014-09-02 LAB — PSA: PSA: 0.54 ng/mL (ref <=4.00)

## 2014-09-02 LAB — TRICHOMONAS VAGINALIS, PROBE AMP: T vaginalis RNA: NEGATIVE

## 2014-09-02 NOTE — Progress Notes (Signed)
I have discussed this case with Ms. English, PA-C and agree.  

## 2014-09-21 ENCOUNTER — Other Ambulatory Visit: Payer: Self-pay | Admitting: Nurse Practitioner

## 2014-10-13 ENCOUNTER — Other Ambulatory Visit: Payer: Self-pay | Admitting: Nurse Practitioner

## 2014-10-13 NOTE — Telephone Encounter (Signed)
Rx was sent to pharmacy electronically. 

## 2014-11-06 ENCOUNTER — Other Ambulatory Visit: Payer: Self-pay | Admitting: Internal Medicine

## 2014-11-08 NOTE — Telephone Encounter (Signed)
Rx(s) sent to pharmacy electronically.  

## 2015-01-11 ENCOUNTER — Telehealth: Payer: Self-pay | Admitting: Internal Medicine

## 2015-01-11 MED ORDER — RIVAROXABAN 20 MG PO TABS: 20.0000 mg | ORAL_TABLET | Freq: Every day | ORAL | Status: DC

## 2015-01-11 MED ORDER — METOPROLOL SUCCINATE ER 25 MG PO TB24: 25.0000 mg | ORAL_TABLET | Freq: Every day | ORAL | Status: DC

## 2015-01-11 NOTE — Telephone Encounter (Signed)
°  1. Which medications need to be refilled? Xarelto 20mg  / Metoprolol 25mg     2. Which pharmacy is medication to be sent to?[facility] on [address]/ [facility] [address]   3. Do they need a 30 day or 90 day supply? 30/30   4. Would they like a call back once the medication has been sent to the pharmacy? Yes

## 2015-01-11 NOTE — Telephone Encounter (Signed)
ERX sent to pharmacy  Patient notified that RX sent in.  Also, scheduled patient for ROV.

## 2015-04-07 ENCOUNTER — Ambulatory Visit (INDEPENDENT_AMBULATORY_CARE_PROVIDER_SITE_OTHER): Payer: BLUE CROSS/BLUE SHIELD | Admitting: Internal Medicine

## 2015-04-07 ENCOUNTER — Encounter: Payer: Self-pay | Admitting: Internal Medicine

## 2015-04-07 VITALS — BP 126/90 | HR 50 | Ht 77.0 in | Wt 231.1 lb

## 2015-04-07 DIAGNOSIS — I493 Ventricular premature depolarization: Secondary | ICD-10-CM

## 2015-04-07 DIAGNOSIS — Z86718 Personal history of other venous thrombosis and embolism: Secondary | ICD-10-CM

## 2015-04-07 DIAGNOSIS — I10 Essential (primary) hypertension: Secondary | ICD-10-CM

## 2015-04-07 DIAGNOSIS — I480 Paroxysmal atrial fibrillation: Secondary | ICD-10-CM | POA: Diagnosis not present

## 2015-04-07 NOTE — Patient Instructions (Signed)
Dr Hilty recommends that you schedule a follow-up appointment in 1 year. You will receive a reminder letter in the mail two months in advance. If you don't receive a letter, please call our office to schedule the follow-up appointment. 

## 2015-04-10 NOTE — Progress Notes (Signed)
OFFICE NOTE  Chief Complaint:  No complaints  Primary Care Physician: Paulino Rily, MD  HPI:  Kyle Ryan is a 54 year old gentleman who I have been following for atrial fibrillation, which he has had for about 2 years paroxysmally. He recently developed DVT, was placed on Xarelto, and was set up for cardioversion; however, spontaneously converted to sinus and never had that. He was at risk for post-thrombotic syndrome and I recommended wearing a lower extremity compression stocking, which has helped with his swelling, and he continues to wear that today. Unfortunately, repeat Dopplers on January 07, 2012, showed persistent DVT in the femoral and popliteal veins with recanalization noted at the mid femoral level, consistent with a chronic DVT. At this point, we discussed staying on the Xarelto versus coming off to go on aspirin. He is on aspirin as well, and for now I would recommend staying on that for at least 6 months and perhaps consider repeating Dopplers at that time. Recently he underwent colonoscopy and required bridging therapy but did well with that. There were no bleeding complications. He reports continued swelling without the use of a stocking, suggestive of post-thrombotic syndrome. Incidentally, he was noted to be in atrial fibrillation today, however he is unaware of this. He has had this intermittently, to my knowledge, since 2010.  [name] returns today for a urgent visit that is preoperative. He recently called the office for preoperative clearance prior to an upcoming knee surgery. It was requested that he hold his Xarelto for a period of time which was longer than typical however this has concerned him about possibly exposing it to increased risk of blood clots. He is reporting significant anxiety about the surgery and feels that he's been having palpitations. In the office today his EKG shows PVCs which are new. He does have a history of some PVCs in the past which  was in the setting of atrial fibrillation however his rhythm is sinus today. He does not report any recent excessive alcohol use. He denies any chest pain.  I saw [name] back in the office today. Overall he is doing very well. He is on long-term Xarelto for chronic DVT. Recently his primary care provider changes medications including switching lisinopril to valsartan and for possible side effects. He seems to be tolerating that well. His Toprol was also increased from 25-50 mg daily. Blood pressure is fairly well-controlled today. He denies any adverse bleeding on Xarelto.   PMHx:  Past Medical History  Diagnosis Date  . DVT (deep venous thrombosis) 10/04/2011    on Xarelto  . Hypertension   . PAF (paroxysmal atrial fibrillation)   . ED (erectile dysfunction)   . Limb pain     LEA VENOUS DUPLEX, 12/31/2011 - positive DVT in femoral and popliteal veins    Past Surgical History  Procedure Laterality Date  . Arthroscopy of knee      FAMHx:  Family History  Problem Relation Age of Onset  . Diabetes Mother     Type II  . Hypertension Mother   . Stroke Mother   . Arrhythmia Mother   . Hypertension Brother   . Hypertension Sister   . Diabetes Sister     Type II    SOCHx:   reports that he quit smoking about 3 years ago. He does not have any smokeless tobacco history on file. He reports that he drinks alcohol. He reports that he uses illicit drugs (Marijuana).  ALLERGIES:  Allergies  Allergen  Reactions  . Flagyl [Metronidazole Hcl] Rash    ROS: A comprehensive review of systems was negative.  HOME MEDS: Current Outpatient Prescriptions  Medication Sig Dispense Refill  . ibuprofen (ADVIL,MOTRIN) 800 MG tablet Take 1 tablet (800 mg total) by mouth daily as needed. Use with EXTREME caution while taking Xarelto 30 tablet 0  . metoprolol succinate (TOPROL-XL) 50 MG 24 hr tablet Take 50 mg by mouth daily. Take with or immediately following a meal.    . rivaroxaban (XARELTO)  20 MG TABS tablet Take 1 tablet (20 mg total) by mouth daily with supper. 30 tablet 2  . Tamsulosin HCl (FLOMAX) 0.4 MG CAPS Take 0.4 mg by mouth daily after supper.      . valsartan (DIOVAN) 80 MG tablet Take 80 mg by mouth daily.     Current Facility-Administered Medications  Medication Dose Route Frequency Provider Last Rate Last Dose  . cefTRIAXone (ROCEPHIN) injection 250 mg  250 mg Intramuscular Q24H Elvina SidleKurt Lauenstein, MD   250 mg at 11/16/13 1651    LABS/IMAGING: No results found for this or any previous visit (from the past 48 hour(s)). No results found.  VITALS: BP 126/90 mmHg  Pulse 50  Ht 6\' 5"  (1.956 m)  Wt 231 lb 1.6 oz (104.826 kg)  BMI 27.40 kg/m2  EXAM: General appearance: alert and no distress Neck: no adenopathy, no carotid bruit, no JVD, supple, symmetrical, trachea midline and thyroid not enlarged, symmetric, no tenderness/mass/nodules Lungs: clear to auscultation bilaterally Heart: irregularly irregular rhythm Abdomen: soft, non-tender; bowel sounds normal; no masses,  no organomegaly Extremities: edema Right lower extremity swelling, stocking in place Pulses: 2+ and symmetric Skin: Skin color, texture, turgor normal. No rashes or lesions Neurologic: Grossly normal  EKG: Sinus bradycardia at 50  ASSESSMENT: 1. Symptomatic PVC's - resolved with increased B=blocker 2. Right DVT, unresolved, on chronic Xarelto therapy 3. 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 HTN - controlled 5. PAF - on xarelto  PLAN: 1.   Mr. Kyle Ryan seems to be tolerating Xarelto and likely will take this medicine chronically for unresolved or chronic DVT. He had a history of synthetic PVCs but those have resolved on increased dose beta blocker. His blood pressure is well controlled. We'll continue his current medications plan to see him back annually or sooner as necessary.  Chrystie NoseKenneth C. Victorine Mcnee, MD, [facility] Attending Cardiologist [facility]  Chrystie NoseKenneth C Kahari Critzer 04/10/2015, 10:44 AM

## 2015-05-05 ENCOUNTER — Other Ambulatory Visit: Payer: Self-pay | Admitting: Internal Medicine

## 2015-09-13 ENCOUNTER — Ambulatory Visit (INDEPENDENT_AMBULATORY_CARE_PROVIDER_SITE_OTHER): Payer: BLUE CROSS/BLUE SHIELD | Admitting: Internal Medicine

## 2015-09-13 VITALS — BP 160/100 | HR 72 | Temp 98.8°F | Resp 16 | Ht 76.0 in | Wt 232.4 lb

## 2015-09-13 DIAGNOSIS — I10 Essential (primary) hypertension: Secondary | ICD-10-CM

## 2015-09-13 DIAGNOSIS — Z7189 Other specified counseling: Secondary | ICD-10-CM

## 2015-09-13 DIAGNOSIS — Z202 Contact with and (suspected) exposure to infections with a predominantly sexual mode of transmission: Secondary | ICD-10-CM

## 2015-09-13 DIAGNOSIS — A599 Trichomoniasis, unspecified: Secondary | ICD-10-CM

## 2015-09-13 MED ORDER — AZITHROMYCIN 500 MG PO TABS: 500.0000 mg | ORAL_TABLET | Freq: Every day | ORAL | Status: DC

## 2015-09-13 MED ORDER — TINIDAZOLE 500 MG PO TABS: 2.0000 g | ORAL_TABLET | Freq: Every day | ORAL | Status: DC

## 2015-09-13 MED ORDER — DOXYCYCLINE HYCLATE 100 MG PO TABS: 100.0000 mg | ORAL_TABLET | Freq: Two times a day (BID) | ORAL | Status: DC

## 2015-09-13 MED ORDER — CEFTRIAXONE SODIUM 250 MG IJ SOLR
250.0000 mg | Freq: Once | INTRAMUSCULAR | Status: AC
  Administered 2015-09-13: 250 mg via INTRAMUSCULAR

## 2015-09-13 NOTE — Progress Notes (Signed)
Patient ID: Kyle Ryan, male   DOB: 1961-06-21, 54 y.o.   MRN: [id number]   09/13/2015 at 8:38 AM  Kyle Ryan / DOB: [birth year] / MRN: [id number]  Problem list reviewed and updated by me where necessary.   SUBJECTIVE  Kyle Ryan is a 54 y.o. well appearing male presenting for the chief complaint of STD exposure..His known partner recently treated for trichomonas. He has no sxs. No dysuria, discharge, rash, sores, fever.     He  has a past medical history of DVT (deep venous thrombosis) (HCC) (10/04/2011); Hypertension; PAF (paroxysmal atrial fibrillation) (HCC); ED (erectile dysfunction); and Limb pain.    Medications reviewed and updated by myself where necessary, and exist elsewhere in the encounter.   Kyle Ryan is allergic to flagyl. He  reports that he quit smoking about 4 years ago. He does not have any smokeless tobacco history on file. He reports that he drinks alcohol. He reports that he uses illicit drugs (Marijuana). He  reports that he currently engages in sexual activity. He reports using the following method of birth control/protection: None. The patient  has past surgical history that includes arthroscopy of knee.  His family history includes Arrhythmia in his mother; Diabetes in his mother and sister; Hypertension in his brother, mother, and sister; Stroke in his mother.  Review of Systems  Constitutional: Negative for fever.  Respiratory: Negative for shortness of breath.   Cardiovascular: Negative for chest pain.  Gastrointestinal: Negative for nausea.  Skin: Negative for rash.  Neurological: Negative for dizziness and headaches.    OBJECTIVE  His  height is  (1.93 m) and weight is 232 lb 6.4 oz (105.416 kg). His oral temperature is 98.8 F (37.1 C). His blood pressure is 160/100 and his pulse is 72. His respiration is 16 and oxygen saturation is 99%.  The patient's body mass index is 28.3 kg/(m^2).  Physical Exam  Constitutional: He is oriented to person,  place, and time. He appears well-developed and well-nourished. No distress.  HENT:  Head: Normocephalic.  Nose: Nose normal.  Eyes: Conjunctivae and EOM are normal. No scleral icterus.  Cardiovascular: Normal rate and regular rhythm.   Respiratory: Effort normal.  Genitourinary: Penis normal.  Musculoskeletal: Normal range of motion.  Neurological: He is alert and oriented to person, place, and time. He exhibits normal muscle tone. Coordination normal.  Skin: No rash noted.  Psychiatric: He has a normal mood and affect.    No results found for this or any previous visit (from the past 24 hour(s)).  ASSESSMENT & PLAN  Kyle Ryan was seen today for exposure to std.  Diagnoses and all orders for this visit:  Trichomonal infection -     tinidazole (TINDAMAX) 500 MG tablet; Take 4 tablets (2,000 mg total) by mouth daily with breakfast. -     cefTRIAXone (ROCEPHIN) injection 250 mg; Inject 250 mg into the muscle once. -     doxycycline (VIBRA-TABS) 100 MG tablet; Take 1 tablet (100 mg total) by mouth 2 (two) times daily.  STD exposure -     tinidazole (TINDAMAX) 500 MG tablet; Take 4 tablets (2,000 mg total) by mouth daily with breakfast. -     cefTRIAXone (ROCEPHIN) injection 250 mg; Inject 250 mg into the muscle once. -     doxycycline (VIBRA-TABS) 100 MG tablet; Take 1 tablet (100 mg total) by mouth 2 (two) times daily.  Encounter for medication review and counseling -     tinidazole (TINDAMAX) 500  MG tablet; Take 4 tablets (2,000 mg total) by mouth daily with breakfast. -     cefTRIAXone (ROCEPHIN) injection 250 mg; Inject 250 mg into the muscle once. -     doxycycline (VIBRA-TABS) 100 MG tablet; Take 1 tablet (100 mg total) by mouth 2 (two) times daily.  Essential hypertension  See your family doctor to f/up soon. Take your meds daily

## 2015-09-13 NOTE — Patient Instructions (Addendum)
Sexually Transmitted Disease A sexually transmitted disease (STD) is a disease or infection that may be passed (transmitted) from person to person, usually during sexual activity. This may happen by way of saliva, semen, blood, vaginal mucus, or urine. Common STDs include:  Gonorrhea.  Chlamydia.  Syphilis.  HIV and AIDS.  Genital herpes.  Hepatitis B and C.  Trichomonas.  Human papillomavirus (HPV).  Pubic lice.  Scabies.  Mites.  Bacterial vaginosis. WHAT ARE CAUSES OF STDs? An STD may be caused by bacteria, a virus, or parasites. STDs are often transmitted during sexual activity if one person is infected. However, they may also be transmitted through nonsexual means. STDs may be transmitted after:   Sexual intercourse with an infected person.  Sharing sex toys with an infected person.  Sharing needles with an infected person or using unclean piercing or tattoo needles.  Having intimate contact with the genitals, mouth, or rectal areas of an infected person.  Exposure to infected fluids during birth. WHAT ARE THE SIGNS AND SYMPTOMS OF STDs? Different STDs have different symptoms. Some people may not have any symptoms. If symptoms are present, they may include:  Painful or bloody urination.  Pain in the pelvis, abdomen, vagina, anus, throat, or eyes.  A skin rash, itching, or irritation.  Growths, ulcerations, blisters, or sores in the genital and anal areas.  Abnormal vaginal discharge with or without bad odor.  Penile discharge in men.  Fever.  Pain or bleeding during sexual intercourse.  Swollen glands in the groin area.  Yellow skin and eyes (jaundice). This is seen with hepatitis.  Swollen testicles.  Infertility.  Sores and blisters in the mouth. HOW ARE STDs DIAGNOSED? To make a diagnosis, your health care provider may:  Take a medical history.  Perform a physical exam.  Take a sample of any discharge to examine.  Swab the throat,  cervix, opening to the penis, rectum, or vagina for testing.  Test a sample of your first morning urine.  Perform blood tests.  Perform a Pap test, if this applies.  Perform a colposcopy.  Perform a laparoscopy. HOW ARE STDs TREATED? Treatment depends on the STD. Some STDs may be treated but not cured.  Chlamydia, gonorrhea, trichomonas, and syphilis can be cured with antibiotic medicine.  Genital herpes, hepatitis, and HIV can be treated, but not cured, with prescribed medicines. The medicines lessen symptoms.  Genital warts from HPV can be treated with medicine or by freezing, burning (electrocautery), or surgery. Warts may come back.  HPV cannot be cured with medicine or surgery. However, abnormal areas may be removed from the cervix, vagina, or vulva.  If your diagnosis is confirmed, your recent sexual partners need treatment. This is true even if they are symptom-free or have a negative culture or evaluation. They should not have sex until their health care providers say it is okay.  Your health care provider may test you for infection again 3 months after treatment. HOW CAN I REDUCE MY RISK OF GETTING AN STD? Take these steps to reduce your risk of getting an STD:  Use latex condoms, dental dams, and water-soluble lubricants during sexual activity. Do not use petroleum jelly or oils.  Avoid having multiple sex partners.  Do not have sex with someone who has other sex partners  Do not have sex with anyone you do not know or who is at high risk for an STD.  Avoid risky sex practices that can break your skin.  Do not have sex  if you have open sores on your mouth or skin.  Avoid drinking too much alcohol or taking illegal drugs. Alcohol and drugs can affect your judgment and put you in a vulnerable position.  Avoid engaging in oral and anal sex acts.  Get vaccinated for HPV and hepatitis. If you have not received these vaccines in the past, talk to your health care  provider about whether one or both might be right for you.  If you are at risk of being infected with HIV, it is recommended that you take a prescription medicine daily to prevent HIV infection. This is called pre-exposure prophylaxis (PrEP). You are considered at risk if:  You are a man who has sex with other men (MSM).  You are a heterosexual man or woman and are sexually active with more than one partner.  You take drugs by injection.  You are sexually active with a partner who has HIV.  Talk with your health care provider about whether you are at high risk of being infected with HIV. If you choose to begin PrEP, you should first be tested for HIV. You should then be tested every 3 months for as long as you are taking PrEP. WHAT SHOULD I DO IF I THINK I HAVE AN STD?  See your health care provider.  Tell your sexual partner(s). They should be tested and treated for any STDs.  Do not have sex until your health care provider says it is okay. WHEN SHOULD I GET IMMEDIATE MEDICAL CARE? Contact your health care provider right away if:   You have severe abdominal pain.  You are a man and notice swelling or pain in your testicles.  You are a woman and notice swelling or pain in your vagina.   This information is not intended to replace advice given to you by your health care provider. Make sure you discuss any questions you have with your health care provider.   Document Released: 01/12/2003 Document Revised: 11/12/2014 Document Reviewed: 05/12/2013 Elsevier Interactive Patient Education 2016 [employer]. Trichomoniasis Trichomoniasis is an infection caused by an organism called Trichomonas. The infection can affect both women and men. In women, the outer male genitalia and the vagina are affected. In men, the penis is mainly affected, but the prostate and other reproductive organs can also be involved. Trichomoniasis is a sexually transmitted infection (STI) and is most often passed  to another person through sexual contact.  RISK FACTORS  Having unprotected sexual intercourse.  Having sexual intercourse with an infected partner. SIGNS AND SYMPTOMS  Symptoms of trichomoniasis in women include:  Abnormal gray-green frothy vaginal discharge.  Itching and irritation of the vagina.  Itching and irritation of the area outside the vagina. Symptoms of trichomoniasis in men include:   Penile discharge with or without pain.  Pain during urination. This results from inflammation of the urethra. DIAGNOSIS  Trichomoniasis may be found during a Pap test or physical exam. Your health care provider may use one of the following methods to help diagnose this infection:  Testing the pH of the vagina with a test tape.  Using a vaginal swab test that checks for the Trichomonas organism. A test is available that provides results within a few minutes.  Examining a urine sample.  Testing vaginal secretions. Your health care provider may test you for other STIs, including HIV. TREATMENT   You may be given medicine to fight the infection. Women should inform their health care provider if they could be or are  pregnant. Some medicines used to treat the infection should not be taken during pregnancy.  Your health care provider may recommend over-the-counter medicines or creams to decrease itching or irritation.  Your sexual partner will need to be treated if infected.  Your health care provider may test you for infection again 3 months after treatment. HOME CARE INSTRUCTIONS   Take medicines only as directed by your health care provider.  Take over-the-counter medicine for itching or irritation as directed by your health care provider.  Do not have sexual intercourse while you have the infection.  Women should not douche or wear tampons while they have the infection.  Discuss your infection with your partner. Your partner may have gotten the infection from you, or you may  have gotten it from your partner.  Have your sex partner get examined and treated if necessary.  Practice safe, informed, and protected sex.  See your health care provider for other STI testing. SEEK MEDICAL CARE IF:   You still have symptoms after you finish your medicine.  You develop abdominal pain.  You have pain when you urinate.  You have bleeding after sexual intercourse.  You develop a rash.  Your medicine makes you sick or makes you throw up (vomit). MAKE SURE YOU:  Understand these instructions.  Will watch your condition.  Will get help right away if you are not doing well or get worse.   This information is not intended to replace advice given to you by your health care provider. Make sure you discuss any questions you have with your health care provider.   Document Released: 04/17/2001 Document Revised: 11/12/2014 Document Reviewed: 08/03/2013 Elsevier Interactive Patient Education 2016 [employer]. Hypertension Hypertension, commonly called high blood pressure, is when the force of blood pumping through your arteries is too strong. Your arteries are the blood vessels that carry blood from your heart throughout your body. A blood pressure reading consists of a higher number over a lower number, such as 110/72. The higher number (systolic) is the pressure inside your arteries when your heart pumps. The lower number (diastolic) is the pressure inside your arteries when your heart relaxes. Ideally you want your blood pressure below 120/80. Hypertension forces your heart to work harder to pump blood. Your arteries may become narrow or stiff. Having untreated or uncontrolled hypertension can cause heart attack, stroke, kidney disease, and other problems. RISK FACTORS Some risk factors for high blood pressure are controllable. Others are not.  Risk factors you cannot control include:   Race. You may be at higher risk if you are African American.  Age. Risk increases  with age.  Gender. Men are at higher risk than women before age 66 years. After age 15, women are at higher risk than men. Risk factors you can control include:  Not getting enough exercise or physical activity.  Being overweight.  Getting too much fat, sugar, calories, or salt in your diet.  Drinking too much alcohol. SIGNS AND SYMPTOMS Hypertension does not usually cause signs or symptoms. Extremely high blood pressure (hypertensive crisis) may cause headache, anxiety, shortness of breath, and nosebleed. DIAGNOSIS To check if you have hypertension, your health care provider will measure your blood pressure while you are seated, with your arm held at the level of your heart. It should be measured at least twice using the same arm. Certain conditions can cause a difference in blood pressure between your right and left arms. A blood pressure reading that is higher than normal  on one occasion does not mean that you need treatment. If it is not clear whether you have high blood pressure, you may be asked to return on a different day to have your blood pressure checked again. Or, you may be asked to monitor your blood pressure at home for 1 or more weeks. TREATMENT Treating high blood pressure includes making lifestyle changes and possibly taking medicine. Living a healthy lifestyle can help lower high blood pressure. You may need to change some of your habits. Lifestyle changes may include:  Following the DASH diet. This diet is high in fruits, vegetables, and whole grains. It is low in salt, red meat, and added sugars.  Keep your sodium intake below 2,300 mg per day.  Getting at least 30-45 minutes of aerobic exercise at least 4 times per week.  Losing weight if necessary.  Not smoking.  Limiting alcoholic beverages.  Learning ways to reduce stress. Your health care provider may prescribe medicine if lifestyle changes are not enough to get your blood pressure under control, and if one  of the following is true:  You are 6918-56 years of age and your systolic blood pressure is above 140.  You are 10860 years of age or older, and your systolic blood pressure is above 150.  Your diastolic blood pressure is above 90.  You have diabetes, and your systolic blood pressure is over 140 or your diastolic blood pressure is over 90.  You have kidney disease and your blood pressure is above 140/90.  You have heart disease and your blood pressure is above 140/90. Your personal target blood pressure may vary depending on your medical conditions, your age, and other factors. HOME CARE INSTRUCTIONS  Have your blood pressure rechecked as directed by your health care provider.   Take medicines only as directed by your health care provider. Follow the directions carefully. Blood pressure medicines must be taken as prescribed. The medicine does not work as well when you skip doses. Skipping doses also puts you at risk for problems.  Do not smoke.   Monitor your blood pressure at home as directed by your health care provider. SEEK MEDICAL CARE IF:   You think you are having a reaction to medicines taken.  You have recurrent headaches or feel dizzy.  You have swelling in your ankles.  You have trouble with your vision. SEEK IMMEDIATE MEDICAL CARE IF:  You develop a severe headache or confusion.  You have unusual weakness, numbness, or feel faint.  You have severe chest or abdominal pain.  You vomit repeatedly.  You have trouble breathing. MAKE SURE YOU:   Understand these instructions.  Will watch your condition.  Will get help right away if you are not doing well or get worse.   This information is not intended to replace advice given to you by your health care provider. Make sure you discuss any questions you have with your health care provider.   Document Released: 10/22/2005 Document Revised: 03/08/2015 Document Reviewed: 08/14/2013 Elsevier Interactive Patient  Education [employer]2016 Elsevier Inc.

## 2015-12-06 ENCOUNTER — Ambulatory Visit (INDEPENDENT_AMBULATORY_CARE_PROVIDER_SITE_OTHER): Payer: BLUE CROSS/BLUE SHIELD | Admitting: Physician Assistant

## 2015-12-06 VITALS — BP 130/80 | HR 55 | Temp 97.7°F | Resp 16 | Ht 76.0 in | Wt 229.0 lb

## 2015-12-06 DIAGNOSIS — Z202 Contact with and (suspected) exposure to infections with a predominantly sexual mode of transmission: Secondary | ICD-10-CM

## 2015-12-06 DIAGNOSIS — Z113 Encounter for screening for infections with a predominantly sexual mode of transmission: Secondary | ICD-10-CM

## 2015-12-06 LAB — RPR

## 2015-12-06 LAB — HIV ANTIBODY (ROUTINE TESTING W REFLEX): HIV 1&2 Ab, 4th Generation: NONREACTIVE

## 2015-12-06 LAB — HEPATITIS B SURFACE ANTIGEN: Hepatitis B Surface Ag: NEGATIVE

## 2015-12-06 LAB — HEPATITIS C ANTIBODY: HCV Ab: NEGATIVE

## 2015-12-06 MED ORDER — TINIDAZOLE 500 MG PO TABS: ORAL_TABLET | ORAL | Status: DC

## 2015-12-06 MED ORDER — CEFTRIAXONE SODIUM 250 MG IJ SOLR
250.0000 mg | Freq: Once | INTRAMUSCULAR | Status: AC
  Administered 2015-12-06: 250 mg via INTRAMUSCULAR

## 2015-12-06 NOTE — Progress Notes (Signed)
[facility] [address] [place] [address] [id number] [phone]  Date:  12/06/2015   Name:  Kyle Ryan   DOB:  Dec 16, 1960   MRN:  [id number]  PCP:  Paulino Rily, MD    Chief Complaint: STD checking   History of Present Illness:  This is a 55 y.o. male with PMH HTN, A fib, hx DVT who is presenting for STD testing. He states a new male partner called him recently and told him she has trichomonas. He states he is very worried and wants to be treated for everything. He is allergic to flagyl but has been treated with tinidazole in the past and no problems. He is wanting an injection of rocephin. I explained this is not indicated for trichomonas. He states "I'm very nervous and I'm not leaving until I get the shot". He is also requesting azithromycin. He then states "this is why I wanted to talk to a man - a man would understand". He is asymptomatic. No dysuria, urinary freq, penile discharge, abdominal pain, back pain, fever, n/v.  Of note he has been here 11 times in the past 3 years for STD exposure/symptoms related to STDs. He always asks for a rocephin inj, azithromycin and tinidazole.  Review of Systems:  Review of Systems See HPI  Patient Active Problem List   Diagnosis Date Noted  . PVC's (premature ventricular contractions) 04/06/2014  . History of DVT of lower extremity 10/04/2011  . HTN (hypertension) 10/04/2011  . A-fib (HCC) 10/04/2011    Prior to Admission medications   Medication Sig Start Date End Date Taking? Authorizing Provider  metoprolol succinate (TOPROL-XL) 50 MG 24 hr tablet Take 50 mg by mouth daily. Take with or immediately following a meal.   Yes Historical Provider, MD  Tamsulosin HCl (FLOMAX) 0.4 MG CAPS Take 0.4 mg by mouth daily after supper.     Yes Historical Provider, MD  valsartan (DIOVAN) 80 MG tablet Take 80 mg by mouth daily.   Yes Historical Provider, MD  XARELTO 20 MG TABS tablet TAKE 1 TABLET BY MOUTH DAILY WITH SUPPER  05/05/15  Yes Chrystie Nose, MD    Allergies  Allergen Reactions  . Flagyl [Metronidazole Hcl] Rash    Past Surgical History  Procedure Laterality Date  . Arthroscopy of knee      Social History  Substance Use Topics  . Smoking status: Former Smoker    Quit date: 07/23/2011  . Smokeless tobacco: Never Used  . Alcohol Use: 0.0 oz/week    0 Standard drinks or equivalent per week     Comment: occasional beer    Family History  Problem Relation Age of Onset  . Diabetes Mother     Type II  . Hypertension Mother   . Stroke Mother   . Arrhythmia Mother   . Hypertension Brother   . Hypertension Sister   . Diabetes Sister     Type II    Medication list has been reviewed and updated.  Physical Examination:  Physical Exam  Constitutional: He is oriented to person, place, and time. He appears well-developed and well-nourished. No distress.  HENT:  Head: Normocephalic and atraumatic.  Right Ear: Hearing normal.  Left Ear: Hearing normal.  Nose: Nose normal.  Eyes: Conjunctivae and lids are normal. Right eye exhibits no discharge. Left eye exhibits no discharge. No scleral icterus.  Pulmonary/Chest: Effort normal. No respiratory distress.  Musculoskeletal: Normal range of motion.  Neurological: He is alert and  oriented to person, place, and time.  Skin: Skin is warm, dry and intact. No lesion and no rash noted.  Psychiatric: He has a normal mood and affect. His speech is normal and behavior is normal. Thought content normal.   BP 130/80 mmHg  Pulse 55  Temp(Src) 97.7 F (36.5 C) (Oral)  Resp 16  Ht  (1.93 m)  Wt 229 lb (103.874 kg)  BMI 27.89 kg/m2  SpO2 99%  Assessment and Plan:  1. Screen for STD (sexually transmitted disease) 2. Exposure to trichomonas Pt here saying he was exposed to trich. He is wanting rocephin, tinidazole and azithromycin. I explained that rocephin is not a treatment for trich and I recommended waiting for the tests. Pt got upset  with me - we eventually decided to do the rocephin shot but wait on the testing before prescribing azithromycin. Tinidazole sent to pharmacy. Pt has been here 11 times in past 3 years for similar complaints. We had long discussion about safe sex practices. - GC/Chlamydia Probe Amp - HIV antibody - RPR - Hepatitis B surface antigen - Hepatitis C antibody - Trichomonas vaginalis, RNA - cefTRIAXone (ROCEPHIN) injection 250 mg; Inject 250 mg into the muscle once. - tinidazole (TINDAMAX) 500 MG tablet; Take 4 tabs po once  Dispense: 4 tablet; Refill: 0   [place] [employer]. Dyke Brackett, MHS [facility]  12/06/2015

## 2015-12-06 NOTE — Patient Instructions (Signed)
You got a rocephin shot today. Go to the pharmacy and get tinidazole and take 4 tabs at one time. Take with food. IF you are positive for chlamydia I will sent another 4 tabs to the pharmacy to take at one time. No sexual intercourse for 1 week. In the future, use condoms for every sexual encounter to protect yourself and your partners. Return as needed.

## 2015-12-07 LAB — TRICHOMONAS VAGINALIS, PROBE AMP: T vaginalis RNA: NEGATIVE

## 2015-12-07 LAB — GC/CHLAMYDIA PROBE AMP
CT Probe RNA: NOT DETECTED
GC Probe RNA: NOT DETECTED

## 2015-12-09 ENCOUNTER — Telehealth: Payer: Self-pay

## 2015-12-09 NOTE — Telephone Encounter (Signed)
PATIENT SAW DR. Alwyn Ren ON Monday FOR STD TESTING. HE WOULD LIKE TO GET HIS RESULTS PLEASE. BEST PHONE [phone]  PHARMACY CHOICE IS [facility] ON [address]   MBC

## 2015-12-12 NOTE — Telephone Encounter (Signed)
Call patient: All of his labs were negative.

## 2015-12-12 NOTE — Telephone Encounter (Signed)
Dr. Alwyn Ren   Please see previous message

## 2015-12-13 NOTE — Telephone Encounter (Signed)
Patient advised.

## 2016-01-12 ENCOUNTER — Other Ambulatory Visit: Payer: Self-pay | Admitting: Internal Medicine

## 2016-01-12 ENCOUNTER — Other Ambulatory Visit: Payer: Self-pay | Admitting: *Deleted

## 2016-04-27 ENCOUNTER — Telehealth: Payer: Self-pay | Admitting: Internal Medicine

## 2016-04-27 NOTE — Telephone Encounter (Signed)
Spoke with pt, he lost his bottle Patient aware samples are at the front desk for pick up

## 2016-04-27 NOTE — Telephone Encounter (Signed)
Patient calling the office for samples of medication:   1.  What medication and dosage are you requesting samples for? xarelto 20 mg  2.  Are you currently out of this medication? Donut hole- has about 5 left- refill occurs next month.

## 2016-07-17 ENCOUNTER — Ambulatory Visit (INDEPENDENT_AMBULATORY_CARE_PROVIDER_SITE_OTHER): Payer: BLUE CROSS/BLUE SHIELD | Admitting: Family Medicine

## 2016-07-17 ENCOUNTER — Encounter: Payer: Self-pay | Admitting: Family Medicine

## 2016-07-17 VITALS — BP 132/80 | HR 68 | Temp 97.9°F | Resp 17 | Ht 77.0 in | Wt 230.0 lb

## 2016-07-17 DIAGNOSIS — Z7251 High risk heterosexual behavior: Secondary | ICD-10-CM | POA: Diagnosis not present

## 2016-07-17 DIAGNOSIS — N342 Other urethritis: Secondary | ICD-10-CM | POA: Diagnosis not present

## 2016-07-17 DIAGNOSIS — R369 Urethral discharge, unspecified: Secondary | ICD-10-CM

## 2016-07-17 LAB — POC MICROSCOPIC URINALYSIS (UMFC): Mucus: ABSENT

## 2016-07-17 LAB — POCT URINALYSIS DIP (MANUAL ENTRY)
Bilirubin, UA: NEGATIVE
Blood, UA: NEGATIVE
Glucose, UA: NEGATIVE
Ketones, POC UA: NEGATIVE
Leukocytes, UA: NEGATIVE
Nitrite, UA: NEGATIVE
Protein Ur, POC: NEGATIVE
Spec Grav, UA: <=1.005
Urobilinogen, UA: 0.2
pH, UA: 7

## 2016-07-17 LAB — HIV ANTIBODY (ROUTINE TESTING W REFLEX): HIV 1&2 Ab, 4th Generation: NONREACTIVE

## 2016-07-17 MED ORDER — CEFTRIAXONE SODIUM 250 MG IJ SOLR
250.0000 mg | Freq: Once | INTRAMUSCULAR | Status: AC
  Administered 2016-07-17: 250 mg via INTRAMUSCULAR

## 2016-07-17 MED ORDER — AZITHROMYCIN 250 MG PO TABS: 1000.0000 mg | ORAL_TABLET | Freq: Once | ORAL | 0 refills | Status: AC

## 2016-07-17 NOTE — Progress Notes (Signed)
Subjective:  By signing my name below, I, Kyle Ryan, attest that this documentation has been prepared under the direction and in the presence of Kyle Staggers, MD. Electronically Signed: Stann Ryan, Scribe. 07/17/2016 , 8:31 AM .  Patient was seen in Room 9 .   Patient ID: Kyle Ryan, male    DOB: [birth year], 55 y.o.   MRN: [id number] Chief Complaint  Patient presents with  . Exposure to STD    discharge and pain    HPI Kyle Ryan is a 55 y.o. male  Patient states he's noticed penile pain at the head of his penis as well as penile discharge started yesterday. He describes the discharge being clear and transparent. He hasn't noticed discharge today. He informs having a new sexual partner about a month ago, mostly protected but there was a component of unprotected sex when the condom fell off. He denies applying any medication, cream or lotions over the area. He denies fever, fatigue, sweats, dysuria, hematuria, or rash around the area. He denies any history of STD's. He's sexually active with females only.   He's allergic to flagyl, which causes a rash.   Patient Active Problem List   Diagnosis Date Noted  . PVC's (premature ventricular contractions) 04/06/2014  . History of DVT of lower extremity 10/04/2011  . HTN (hypertension) 10/04/2011  . A-fib (HCC) 10/04/2011   Past Medical History:  Diagnosis Date  . DVT (deep venous thrombosis) (HCC) 10/04/2011   on Xarelto  . ED (erectile dysfunction)   . Hypertension   . Limb pain    LEA VENOUS DUPLEX, 12/31/2011 - positive DVT in femoral and popliteal veins  . PAF (paroxysmal atrial fibrillation) (HCC)    Past Surgical History:  Procedure Laterality Date  . arthroscopy of knee     Allergies  Allergen Reactions  . Flagyl [Metronidazole Hcl] Rash   Prior to Admission medications   Medication Sig Start Date End Date Taking? Authorizing Provider  metoprolol succinate (TOPROL-XL) 50 MG 24 hr tablet Take 50 mg by mouth  daily. Take with or immediately following a meal.   Yes Historical Provider, MD  Tamsulosin HCl (FLOMAX) 0.4 MG CAPS Take 0.4 mg by mouth daily after supper.     Yes Historical Provider, MD  tinidazole (TINDAMAX) 500 MG tablet Take 4 tabs po once 12/06/15  Yes [place], PA-C  valsartan (DIOVAN) 80 MG tablet Take 80 mg by mouth daily.   Yes Historical Provider, MD  XARELTO 20 MG TABS tablet TAKE 1 TABLET BY MOUTH DAILY WITH DINNER 01/12/16  Yes Chrystie Nose, MD   Social History   Social History  . Marital status: Single    Spouse name: N/A  . Number of children: N/A  . Years of education: N/A   Occupational History  . Not on file.   Social History Main Topics  . Smoking status: Former Smoker    Quit date: 07/23/2011  . Smokeless tobacco: Never Used  . Alcohol use 0.0 oz/week     Comment: occasional beer  . Drug use:     Types: Marijuana  . Sexual activity: Yes    Birth control/ protection: None   Other Topics Concern  . Not on file   Social History Narrative  . No narrative on file   Review of Systems  Constitutional: Negative for chills, diaphoresis, fatigue and fever.  Gastrointestinal: Negative for abdominal pain, diarrhea, nausea and vomiting.  Genitourinary: Positive for discharge and penile pain. Negative  for dysuria, genital sores and hematuria.  Skin: Negative for rash.       Objective:   Physical Exam  Constitutional: He is oriented to person, place, and time. He appears well-developed and well-nourished. No distress.  HENT:  Head: Normocephalic and atraumatic.  Eyes: EOM are normal. Pupils are equal, round, and reactive to light.  Neck: Neck supple.  Cardiovascular: Normal rate.   Pulmonary/Chest: Effort normal. No respiratory distress.  Abdominal: Soft. Bowel sounds are normal. He exhibits no distension. There is no tenderness. There is no CVA tenderness.  Genitourinary: Penis normal. Right testis shows no mass and no tenderness. Left testis shows no  mass and no tenderness. No penile erythema or penile tenderness. No discharge found.  Genitourinary Comments: No penile/genital rash. No visible discharge at present.   Musculoskeletal: Normal range of motion.  Neurological: He is alert and oriented to person, place, and time.  Skin: Skin is warm and dry.  Psychiatric: He has a normal mood and affect. His behavior is normal.  Nursing note and vitals reviewed.   Vitals:   07/17/16 0815  BP: 132/80  Pulse: 68  Resp: 17  Temp: 97.9 F (36.6 C)  TempSrc: Oral  SpO2: 99%  Weight: 230 lb (104.3 kg)  Height: 6\' 5"  (1.956 m)      Assessment & Plan:    Kyle Ryan is a 55 y.o. male Infective urethritis - Plan: POCT urinalysis dipstick, POCT Microscopic Urinalysis ([facility]), GC/Chlamydia Probe Amp, RPR, HIV antibody, cefTRIAXone (ROCEPHIN) injection 250 mg, azithromycin (ZITHROMAX) 250 MG tablet  History of unprotected sex - Plan: GC/Chlamydia Probe Amp, RPR, HIV antibody, cefTRIAXone (ROCEPHIN) injection 250 mg, azithromycin (ZITHROMAX) 250 MG tablet  Penile discharge - Plan: GC/Chlamydia Probe Amp, RPR, HIV antibody, cefTRIAXone (ROCEPHIN) injection 250 mg, azithromycin (ZITHROMAX) 250 MG tablet  Possible infectious urethritis with history of unprotected intercourse, now with dysuria and penile discharge yesterday.  -  Will treat for common causes of Chlamydia and gonorrhea with azithromycin and Rocephin.   -Check HIV, RPR, uriprobe  - Abstinence or condom use discussed and having partners tested or treated was also discussed. RTC precautions.  Meds ordered this encounter  Medications  . cefTRIAXone (ROCEPHIN) injection 250 mg  . azithromycin (ZITHROMAX) 250 MG tablet    Sig: Take 4 tablets (1,000 mg total) by mouth once.    Dispense:  4 tablet    Refill:  0   Patient Instructions   Based on your symptoms yesterday, and the discharge, will treat for infectious urethritis, or possible STI. Injection given today would cover  gonorrhea, and the 4 pills that were prescribed to your pharmacy should cover Chlamydia. Make sure you use condoms, or abstinence until all symptoms have resolved and completion of treatment, as well as having your partners tested and/or treated based on your current test results. You should hear from me within the next 1 week to 10 days regarding those STI tests. Return to the clinic or go to the nearest emergency room if any of your symptoms worsen or new symptoms occur.   Urethritis, Adult Urethritis is an inflammation of the tube through which urine exits your bladder (urethra).  CAUSES Urethritis is often caused by an infection in your urethra. The infection can be viral, like herpes. The infection can also be bacterial, like gonorrhea. RISK FACTORS Risk factors of urethritis include:  Having sex without using a condom.  Having multiple sexual partners.  Having poor hygiene. SIGNS AND SYMPTOMS Symptoms of urethritis are  less noticeable in women than in men. These symptoms include:  Burning feeling when you urinate (dysuria).  Discharge from your urethra.  Blood in your urine (hematuria).  Urinating more than usual. DIAGNOSIS  To confirm a diagnosis of urethritis, your health care provider will do the following:  Ask about your sexual history.  Perform a physical exam.  Have you provide a sample of your urine for lab testing.  Use a cotton swab to gently collect a sample from your urethra for lab testing. TREATMENT  It is important to treat urethritis. Depending on the cause, untreated urethritis may lead to serious genital infections and possibly infertility. Urethritis caused by a bacterial infection is treated with antibiotic medicine. All sexual partners must be treated.  HOME CARE INSTRUCTIONS  Do not have sex until the test results are known and treatment is completed, even if your symptoms go away before you finish treatment.  If you were prescribed an antibiotic,  finish it all even if you start to feel better. SEEK MEDICAL CARE IF:   Your symptoms are not improved in 3 days.  Your symptoms are getting worse.  You develop abdominal pain or pelvic pain (in women).  You develop joint pain.  You have a fever. SEEK IMMEDIATE MEDICAL CARE IF:   You have severe pain in the belly, back, or side.  You have repeated vomiting. MAKE SURE YOU:  Understand these instructions.  Will watch your condition.  Will get help right away if you are not doing well or get worse.   This information is not intended to replace advice given to you by your health care provider. Make sure you discuss any questions you have with your health care provider.   Document Released: 04/17/2001 Document Revised: 03/08/2015 Document Reviewed: 06/22/2013 Elsevier Interactive Patient Education 2016 [employer].      IF you received an x-ray today, you will receive an invoice from [facility]. Please contact [facility] at [phone] with questions or concerns regarding your invoice.   IF you received labwork today, you will receive an invoice from [employer] [facility]. Please contact [facility] at [phone] with questions or concerns regarding your invoice.   Our billing staff will not be able to assist you with questions regarding bills from these companies.  You will be contacted with the lab results as soon as they are available. The fastest way to get your results is to activate your My Chart account. Instructions are located on the last page of this paperwork. If you have not heard from [place] regarding the results in 2 weeks, please contact this office.        I personally performed the services described in this documentation, which was scribed in my presence. The recorded information has been reviewed and considered, and addended by me as needed.   Signed,   Kyle Staggers, MD [facility].  07/17/16 8:38 AM

## 2016-07-17 NOTE — Patient Instructions (Addendum)
Based on your symptoms yesterday, and the discharge, will treat for infectious urethritis, or possible STI. Injection given today would cover gonorrhea, and the 4 pills that were prescribed to your pharmacy should cover Chlamydia. Make sure you use condoms, or abstinence until all symptoms have resolved and completion of treatment, as well as having your partners tested and/or treated based on your current test results. You should hear from me within the next 1 week to 10 days regarding those STI tests. Return to the clinic or go to the nearest emergency room if any of your symptoms worsen or new symptoms occur.   Urethritis, Adult Urethritis is an inflammation of the tube through which urine exits your bladder (urethra).  CAUSES Urethritis is often caused by an infection in your urethra. The infection can be viral, like herpes. The infection can also be bacterial, like gonorrhea. RISK FACTORS Risk factors of urethritis include:  Having sex without using a condom.  Having multiple sexual partners.  Having poor hygiene. SIGNS AND SYMPTOMS Symptoms of urethritis are less noticeable in women than in men. These symptoms include:  Burning feeling when you urinate (dysuria).  Discharge from your urethra.  Blood in your urine (hematuria).  Urinating more than usual. DIAGNOSIS  To confirm a diagnosis of urethritis, your health care provider will do the following:  Ask about your sexual history.  Perform a physical exam.  Have you provide a sample of your urine for lab testing.  Use a cotton swab to gently collect a sample from your urethra for lab testing. TREATMENT  It is important to treat urethritis. Depending on the cause, untreated urethritis may lead to serious genital infections and possibly infertility. Urethritis caused by a bacterial infection is treated with antibiotic medicine. All sexual partners must be treated.  HOME CARE INSTRUCTIONS  Do not have sex until the test  results are known and treatment is completed, even if your symptoms go away before you finish treatment.  If you were prescribed an antibiotic, finish it all even if you start to feel better. SEEK MEDICAL CARE IF:   Your symptoms are not improved in 3 days.  Your symptoms are getting worse.  You develop abdominal pain or pelvic pain (in women).  You develop joint pain.  You have a fever. SEEK IMMEDIATE MEDICAL CARE IF:   You have severe pain in the belly, back, or side.  You have repeated vomiting. MAKE SURE YOU:  Understand these instructions.  Will watch your condition.  Will get help right away if you are not doing well or get worse.   This information is not intended to replace advice given to you by your health care provider. Make sure you discuss any questions you have with your health care provider.   Document Released: 04/17/2001 Document Revised: 03/08/2015 Document Reviewed: 06/22/2013 Elsevier Interactive Patient Education 2016 [employer]      IF you received an x-ray today, you will receive an invoice from [facility]. Please contact [facility] at [phone] with questions or concerns regarding your invoice.   IF you received labwork today, you will receive an invoice from [facility]. Please contact [facility] at [phone] with questions or concerns regarding your invoice.   Our billing staff will not be able to assist you with questions regarding bills from these companies.  You will be contacted with the lab results as soon as they are available. The fastest way to get your results is to activate your My Chart  account. Instructions are located on the last page of this paperwork. If you have not heard from [place] regarding the results in 2 weeks, please contact this office.

## 2016-07-18 LAB — RPR

## 2016-07-18 LAB — GC/CHLAMYDIA PROBE AMP
CT Probe RNA: NOT DETECTED
GC Probe RNA: NOT DETECTED

## 2016-07-25 ENCOUNTER — Telehealth: Payer: Self-pay

## 2016-07-25 NOTE — Telephone Encounter (Signed)
Patient has been informed of most recent lab results

## 2016-10-04 ENCOUNTER — Ambulatory Visit: Payer: BLUE CROSS/BLUE SHIELD | Admitting: Internal Medicine

## 2017-01-04 ENCOUNTER — Ambulatory Visit (INDEPENDENT_AMBULATORY_CARE_PROVIDER_SITE_OTHER): Payer: BLUE CROSS/BLUE SHIELD | Admitting: Internal Medicine

## 2017-01-04 ENCOUNTER — Encounter: Payer: Self-pay | Admitting: Internal Medicine

## 2017-01-04 VITALS — BP 128/82 | HR 53 | Ht 77.0 in | Wt 229.4 lb

## 2017-01-04 DIAGNOSIS — I10 Essential (primary) hypertension: Secondary | ICD-10-CM | POA: Diagnosis not present

## 2017-01-04 DIAGNOSIS — I480 Paroxysmal atrial fibrillation: Secondary | ICD-10-CM | POA: Diagnosis not present

## 2017-01-04 DIAGNOSIS — Z86718 Personal history of other venous thrombosis and embolism: Secondary | ICD-10-CM | POA: Diagnosis not present

## 2017-01-04 NOTE — Progress Notes (Signed)
OFFICE NOTE  Chief Complaint:  No complaints  Primary Care Physician: Darrow BussingKOIRALA,DIBAS, MD  HPI:  Kyle Ryan is a 56 year old gentleman who I have been following for atrial fibrillation, which he has had for about 2 years paroxysmally. He recently developed DVT, was placed on Xarelto, and was set up for cardioversion; however, spontaneously converted to sinus and never had that. He was at risk for post-thrombotic syndrome and I recommended wearing a lower extremity compression stocking, which has helped with his swelling, and he continues to wear that today. Unfortunately, repeat Dopplers on January 07, 2012, showed persistent DVT in the femoral and popliteal veins with recanalization noted at the mid femoral level, consistent with a chronic DVT. At this point, we discussed staying on the Xarelto versus coming off to go on aspirin. He is on aspirin as well, and for now I would recommend staying on that for at least 6 months and perhaps consider repeating Dopplers at that time. Recently he underwent colonoscopy and required bridging therapy but did well with that. There were no bleeding complications. He reports continued swelling without the use of a stocking, suggestive of post-thrombotic syndrome. Incidentally, he was noted to be in atrial fibrillation today, however he is unaware of this. He has had this intermittently, to my knowledge, since 2010.  Kyle Ryan returns today for a urgent visit that is preoperative. He recently called the office for preoperative clearance prior to an upcoming knee surgery. It was requested that he hold his Xarelto for a period of time which was longer than typical however this has concerned him about possibly exposing it to increased risk of blood clots. He is reporting significant anxiety about the surgery and feels that he's been having palpitations. In the office today his EKG shows PVCs which are new. He does have a history of some PVCs in the past which  was in the setting of atrial fibrillation however his rhythm is sinus today. He does not report any recent excessive alcohol use. He denies any chest pain.  I saw Kyle Ryan back in the office today. Overall he is doing very well. He is on long-term Xarelto for chronic DVT. Recently his primary care provider changes medications including switching lisinopril to valsartan and for possible side effects. He seems to be tolerating that well. His Toprol was also increased from 25-50 mg daily. Blood pressure is fairly well-controlled today. He denies any adverse bleeding on Xarelto.  01/04/2017  Kyle Ryan returns today for follow-up. He reports feeling fairly well. He denies any significant chest pain or worsening shortness of breath. He's had no problems on Xarelto and no recurrent DVT. Blood pressure is well-controlled today. He remains physically active and maintains a fairly good weight.  PMHx:  Past Medical History:  Diagnosis Date  . DVT (deep venous thrombosis) (HCC) 10/04/2011   on Xarelto  . ED (erectile dysfunction)   . Hypertension   . Limb pain    LEA VENOUS DUPLEX, 12/31/2011 - positive DVT in femoral and popliteal veins  . PAF (paroxysmal atrial fibrillation) (HCC)     Past Surgical History:  Procedure Laterality Date  . arthroscopy of knee      FAMHx:  Family History  Problem Relation Age of Onset  . Diabetes Mother     Type II  . Hypertension Mother   . Stroke Mother   . Arrhythmia Mother   . Hypertension Brother   . Hypertension Sister   . Diabetes Sister  Type II    SOCHx:   reports that he quit smoking about 5 years ago. He has never used smokeless tobacco. He reports that he drinks alcohol. He reports that he uses drugs, including Marijuana.  ALLERGIES:  Allergies  Allergen Reactions  . Flagyl [Metronidazole Hcl] Rash    ROS: A comprehensive review of systems was negative.  HOME MEDS: Current Outpatient Prescriptions  Medication Sig Dispense Refill    . metoprolol succinate (TOPROL-XL) 50 MG 24 hr tablet Take 50 mg by mouth daily. Take with or immediately following a meal.    . Tamsulosin HCl (FLOMAX) 0.4 MG CAPS Take 0.4 mg by mouth daily after supper.      Carlena Hurl 20 MG TABS tablet TAKE 1 TABLET BY MOUTH DAILY WITH DINNER 30 tablet 4  . ALPRAZolam (XANAX) 0.5 MG tablet Take 0.5 mg by mouth as needed.    Marland Kitchen amLODipine-valsartan (EXFORGE) 10-160 MG tablet Take 1 tablet by mouth daily.  0   No current facility-administered medications for this visit.     LABS/IMAGING: No results found for this or any previous visit (from the past 48 hour(s)). No results found.  VITALS: BP 128/82   Pulse (!) 53   Ht 6\' 5"  (1.956 m)   Wt 229 lb 6.4 oz (104.1 kg)   BMI 27.20 kg/m   EXAM: General appearance: alert and no distress Neck: no adenopathy, no carotid bruit, no JVD, supple, symmetrical, trachea midline and thyroid not enlarged, symmetric, no tenderness/mass/nodules Lungs: clear to auscultation bilaterally Heart: irregularly irregular rhythm Abdomen: soft, non-tender; bowel sounds normal; no masses,  no organomegaly Extremities: edema Right lower extremity swelling, stocking in place Pulses: 2+ and symmetric Skin: Skin color, texture, turgor normal. No rashes or lesions Neurologic: Grossly normal  EKG: Sinus bradycardia with PACs at 53  ASSESSMENT: 1. Symptomatic PVC's/PACs - resolved with increased B=blocker 2. Right DVT, unresolved, on chronic Xarelto therapy 3. 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 HTN - controlled 5. PAF - on xarelto  PLAN: 1.   Kyle Ryan continues to do well. He is on chronic Xarelto therapy due to a history of DVT which is unresolved and PAF without recurrence. Blood pressure is well-controlled. He's wearing his compression stocking today of the right lower extremity with improvement in swelling. Overall is doing well and will plan to see him back annually or sooner as necessary.  Chrystie Nose, MD,  [facility] Attending Cardiologist [facility]  Kyle Ryan 01/04/2017, 6:18 PM

## 2017-01-04 NOTE — Patient Instructions (Signed)
Dr Hilty recommends that you schedule a follow-up appointment in 1 year. You will receive a reminder letter in the mail two months in advance. If you don't receive a letter, please call our office to schedule the follow-up appointment.  If you need a refill on your cardiac medications before your next appointment, please call your pharmacy. 

## 2017-01-10 ENCOUNTER — Other Ambulatory Visit: Payer: Self-pay | Admitting: Internal Medicine

## 2017-01-22 ENCOUNTER — Telehealth: Payer: Self-pay | Admitting: Internal Medicine

## 2017-01-22 ENCOUNTER — Ambulatory Visit (INDEPENDENT_AMBULATORY_CARE_PROVIDER_SITE_OTHER): Payer: BLUE CROSS/BLUE SHIELD | Admitting: Pharmacist Clinician (PhC)/ Clinical Pharmacy Specialist

## 2017-01-22 ENCOUNTER — Encounter: Payer: Self-pay | Admitting: Internal Medicine

## 2017-01-22 DIAGNOSIS — I480 Paroxysmal atrial fibrillation: Secondary | ICD-10-CM

## 2017-01-22 NOTE — Assessment & Plan Note (Signed)
On Xarelto 20 mg daily.  Will continue, patient knows to call office for samples should he have difficulty with pricing.

## 2017-01-22 NOTE — Progress Notes (Signed)
Patient has been on Xarelto for approx 2 years.  Has history of AF and DVT  Previously his copay was $10 per month, but now on [employer] and copay is $210/month.    Reviewed options for warfarin vs NOAC, patient is on a high deductible plan ($6,500), so if he were on warfarin would have to pay for office visit INR checks.  Patient hesitant to switch medications, as he has several relatives who have suffered strokes.  He also asked about possibly taking Xarelto every other day.    Stressed that the medication would not be effective taking only every other day.  After reviewing options ,he would be more comfortable paying the money for Xarelto.  Encouraged him to call the office from time to time to see if he can get samples, to ease his cost burden.    Patient voiced understanding, and will call when he needs help with samples.

## 2017-01-22 NOTE — Telephone Encounter (Signed)
New Message  Pt c/o medication issue:  1. Name of Medication: xarelto 20 mg tablet once daily  2. How are you currently taking this medication (dosage and times per day)? See above  3. Are you having a reaction (difficulty breathing--STAT)? N/A  4. What is your medication issue? PT voiced insurance is charging 210 for medication when it was free, then went up to $10, not $210 and pt just cannot afford it.  Pt voiced he does not have any medication so possibly some samples will work until he can get generic medication if there is a generic version of xarelto.  Please f/u with pt

## 2017-01-22 NOTE — Telephone Encounter (Signed)
Pt notified, he will be in for appt to discuss medication change

## 2017-01-22 NOTE — Telephone Encounter (Signed)
Warfarin is the only cheaper alternative- I he wants to consider switching, I would need to discuss the pros and cons of this. I believe that warfarin is less ideal, though, for stroke protection. We could try to assist him with patient assistance paperwork or he can contact the company to apply for this.  Dr. HRexene Edison

## 2017-01-22 NOTE — Telephone Encounter (Signed)
Pt walked in to get samples informed pt that there are no samples. Pt states that if we can change medication due to cost of medication. Please advise

## 2017-01-22 NOTE — Telephone Encounter (Signed)
No samples available 

## 2017-02-11 ENCOUNTER — Telehealth: Payer: Self-pay | Admitting: Internal Medicine

## 2017-02-11 NOTE — Telephone Encounter (Signed)
New message ° ° ° ° ° °Patient calling the office for samples of medication: ° ° °1.  What medication and dosage are you requesting samples for? xarelto 20mg °2.  Are you currently out of this medication? Almost out ° ° °

## 2017-02-11 NOTE — Telephone Encounter (Signed)
Spoke to patient, provided samples. He's aware he may pick these up at front desk. We also discussed affordability of med -- he is commercially insured. I provided information about patient assistance and have printed a [facility] [employer] Savings Program application for him.  Pt aware to follow up on this and notify us if he needs further help -- will try to bridge w samples as needed, he's also aware of option to switch to warfarin. Pt voiced thanks for call and assistance.

## 2017-03-04 ENCOUNTER — Telehealth: Payer: Self-pay | Admitting: Internal Medicine

## 2017-03-04 NOTE — Telephone Encounter (Signed)
#  3 lot [id number] exp 4/20 Advised patient at front desk for pick up

## 2017-03-04 NOTE — Telephone Encounter (Signed)
New Message  Patient calling the office for samples of medication:   1.  What medication and dosage are you requesting samples for? zarelto 20 mg tabs once daily at dinner  2.  Are you currently out of this medication?  Yes

## 2017-03-25 ENCOUNTER — Telehealth: Payer: Self-pay | Admitting: Internal Medicine

## 2017-03-25 NOTE — Telephone Encounter (Signed)
Patient calling the office for samples of medication: ° ° °1.  What medication and dosage are you requesting samples for? Xarelto  ° °2.  Are you currently out of this medication? yes ° ° °

## 2017-03-26 NOTE — Telephone Encounter (Signed)
Medication Samples have been provided to the patient.  Drug name: Xarelto Strength: 20 mg Qty: 3 containers  LOT: [id number] Exp.Date: 3/20  Samples left for the patient at the front desk. He verbalized his understanding.

## 2017-04-15 ENCOUNTER — Telehealth: Payer: Self-pay | Admitting: Internal Medicine

## 2017-04-15 MED ORDER — RIVAROXABAN 20 MG PO TABS: ORAL_TABLET | ORAL | 0 refills | Status: DC

## 2017-04-15 NOTE — Telephone Encounter (Signed)
Patient aware samples are at the front desk for pick up  

## 2017-04-15 NOTE — Telephone Encounter (Signed)
New Message  ° ° ° °Patient calling the office for samples of medication: ° ° °1.  What medication and dosage are you requesting samples for?   Xarelto 20 mg ° °2.  Are you currently out of this medication?  Yes  ° ° ° °

## 2017-05-01 ENCOUNTER — Telehealth: Payer: Self-pay | Admitting: Internal Medicine

## 2017-05-01 NOTE — Telephone Encounter (Signed)
°  New Prob  Patient calling the office for samples of medication:   1.  What medication and dosage are you requesting samples for? rivaroxaban (XARELTO) 20 MG TABS tablet  2.  Are you currently out of this medication?  Has 1 sample left (5 pills)

## 2017-05-02 NOTE — Telephone Encounter (Signed)
Pt has samples at front desk from prior request on 6/11. Informed him these were available for pickup. He voiced understanding and thanks.   (No new Xarelto 20mg  samples in stock).

## 2017-05-02 NOTE — Telephone Encounter (Signed)
Pt calling again to see if we had samples of Xarelto please.

## 2017-05-22 ENCOUNTER — Telehealth: Payer: Self-pay | Admitting: Internal Medicine

## 2017-05-22 NOTE — Telephone Encounter (Signed)
New Message     Patient calling the office for samples of medication:   1.  What medication and dosage are you requesting samples for?  Xarelto 20 mg  2.  Are you currently out of this medication?  Has a few days left

## 2017-06-20 ENCOUNTER — Telehealth: Payer: Self-pay | Admitting: Internal Medicine

## 2017-06-20 NOTE — Telephone Encounter (Signed)
New Message  ° ° ° ° °Patient calling the office for samples of medication: ° ° °1.  What medication and dosage are you requesting samples for?  Xarelto 20mg ° °2.  Are you currently out of this medication?  Has a couple left  ° ° ° °

## 2017-06-20 NOTE — Telephone Encounter (Signed)
Medication samples have been provided to the patient.  Drug name: Xarelto 20 mg   Qty: 21 tablets  LOT: [id number]  Exp.Date: 02/21  Samples left at front desk for patient pick-up. Patient notified.

## 2017-07-10 ENCOUNTER — Telehealth: Payer: Self-pay | Admitting: Internal Medicine

## 2017-07-10 NOTE — Telephone Encounter (Signed)
Medication samples have been provided to the patient.  Drug name: Xarelto 20mg   Qty: 21 tabs  LOT: [id number]  Exp.Date: 02/21  Samples left at front desk for patient pick-up. Patient notified.

## 2017-07-10 NOTE — Telephone Encounter (Signed)
Patient calling the office for samples of medication:   1.  What medication and dosage are you requesting samples for? Xarelto  2.  Are you currently out of this medication? a couple left

## 2017-07-31 ENCOUNTER — Telehealth: Payer: Self-pay | Admitting: Internal Medicine

## 2017-07-31 NOTE — Telephone Encounter (Signed)
Patient calling the office for samples of medication:   1.  What medication and dosage are you requesting samples for? xarelto   2.  Are you currently out of this medication? Pt dont know exact amount he said a few

## 2017-08-01 NOTE — Telephone Encounter (Signed)
New Message   pt verbalized that he is following up about his sample request   States he never got a call

## 2017-08-02 NOTE — Telephone Encounter (Signed)
Follow up    Patient calling the office for samples of medication:   1.  What medication and dosage are you requesting samples for? Xarelto    2.  Are you currently out of this medication? Out of medication now

## 2017-08-02 NOTE — Telephone Encounter (Signed)
Called patient back. Informed him no samples available, offered to send Rx. He politely declined. Pt reports he has a few days worth of samples & wants to check back next week. Informed him I would leave msg open in case we get restocked in next few days. Pt verbalized understanding and thanks.

## 2017-08-07 ENCOUNTER — Telehealth: Payer: Self-pay | Admitting: Internal Medicine

## 2017-08-07 NOTE — Telephone Encounter (Signed)
Informed patient samples not available at present time and he can call back to seeif some become available  Patient states even with  Saving card xarelto is over $200 dollars  verbalized understanding.

## 2017-08-07 NOTE — Telephone Encounter (Signed)
New message    Patient calling the office for samples of medication:   1.  What medication and dosage are you requesting samples for? xarelto 20 mg  2.  Are you currently out of this medication? A few pills left.

## 2017-08-13 NOTE — Telephone Encounter (Signed)
Xarelot 20 mg #3 lot [id number] at front desk for pick Left message to call back

## 2017-08-13 NOTE — Telephone Encounter (Signed)
New Message     Patient calling the office for samples of medication:   1.  What medication and dosage are you requesting samples for?  Xarelto 20 mg  2.  Are you currently out of this medication?  1 week left

## 2017-08-14 NOTE — Telephone Encounter (Signed)
Patient aware.

## 2017-09-03 ENCOUNTER — Telehealth: Payer: Self-pay | Admitting: Internal Medicine

## 2017-09-03 NOTE — Telephone Encounter (Signed)
New  Message    Patient calling the office for samples of medication:   1.  What medication and dosage are you requesting samples for? xarelto 20 mg  2.  Are you currently out of this medication? Pt states he has 12 pills left.

## 2017-09-03 NOTE — Telephone Encounter (Signed)
NO SAMPLES AVAILABLE CAN CALL BACK. PATIENT AWARE

## 2017-09-12 ENCOUNTER — Other Ambulatory Visit: Payer: Self-pay | Admitting: Internal Medicine

## 2017-09-12 NOTE — Telephone Encounter (Signed)
Medication samples have been provided to the patient.  Drug name: Xarelto 20  Qty: 14   LOT: [id number]  Exp.Date: 3/21  Samples left at front desk for patient pick-up. Patient notified.

## 2017-09-12 NOTE — Telephone Encounter (Signed)
Patient calling the office for samples of medication:   1.  What medication and dosage are you requesting samples for? Xarelto  2.  Are you currently out of this medication? Just a few left   

## 2017-09-19 ENCOUNTER — Ambulatory Visit (INDEPENDENT_AMBULATORY_CARE_PROVIDER_SITE_OTHER): Payer: BLUE CROSS/BLUE SHIELD | Admitting: Podiatry

## 2017-09-19 ENCOUNTER — Ambulatory Visit: Payer: BLUE CROSS/BLUE SHIELD | Admitting: Podiatry

## 2017-09-19 ENCOUNTER — Ambulatory Visit (INDEPENDENT_AMBULATORY_CARE_PROVIDER_SITE_OTHER): Payer: BLUE CROSS/BLUE SHIELD

## 2017-09-19 ENCOUNTER — Encounter: Payer: Self-pay | Admitting: Podiatry

## 2017-09-19 DIAGNOSIS — M659 Unspecified synovitis and tenosynovitis, unspecified site: Secondary | ICD-10-CM

## 2017-09-19 DIAGNOSIS — L84 Corns and callosities: Secondary | ICD-10-CM

## 2017-09-19 DIAGNOSIS — M205X1 Other deformities of toe(s) (acquired), right foot: Secondary | ICD-10-CM | POA: Diagnosis not present

## 2017-09-19 NOTE — Progress Notes (Signed)
Subjective:    Patient ID: Kyle Ryan, male    DOB: 11/14/1960, 56 y.o.   MRN: [id number]  HPI  56 year old male resents the office today for concerns of corn and pain to the right fifth toe which is been ongoing for about 1 month.  He denies any recent injury or trauma to the area.  He has been using over-the-counter corn remover pads which have not been helping.  He denies any swelling or redness or injury to the toe.  He has no other concerns today.  Review of Systems  All other systems reviewed and are negative.  Past Medical History:  Diagnosis Date  . DVT (deep venous thrombosis) (HCC) 10/04/2011   on Xarelto  . ED (erectile dysfunction)   . Hypertension   . Limb pain    LEA VENOUS DUPLEX, 12/31/2011 - positive DVT in femoral and popliteal veins  . PAF (paroxysmal atrial fibrillation) (HCC)     Past Surgical History:  Procedure Laterality Date  . arthroscopy of knee       Current Outpatient Medications:  .  ALPRAZolam (XANAX) 0.5 MG tablet, Take 0.5 mg by mouth as needed., Disp: , Rfl:  .  amLODipine-valsartan (EXFORGE) 10-160 MG tablet, Take 1 tablet by mouth daily., Disp: , Rfl: 0 .  metoprolol succinate (TOPROL-XL) 50 MG 24 hr tablet, Take 50 mg by mouth daily. Take with or immediately following a meal., Disp: , Rfl:  .  rivaroxaban (XARELTO) 20 MG TABS tablet, TAKE 1 TABLET BY MOUTH DAILY WITH DINNER, Disp: 28 tablet, Rfl: 0 .  Tamsulosin HCl (FLOMAX) 0.4 MG CAPS, Take 0.4 mg by mouth daily after supper.  , Disp: , Rfl:   Allergies  Allergen Reactions  . Flagyl [Metronidazole Hcl] Rash    Social History   Socioeconomic History  . Marital status: Single    Spouse name: Not on file  . Number of children: Not on file  . Years of education: Not on file  . Highest education level: Not on file  Social Needs  . Financial resource strain: Not on file  . Food insecurity - worry: Not on file  . Food insecurity - inability: Not on file  . Transportation needs -  medical: Not on file  . Transportation needs - non-medical: Not on file  Occupational History  . Not on file  Tobacco Use  . Smoking status: Former Smoker    Last attempt to quit: 07/23/2011    Years since quitting: 6.1  . Smokeless tobacco: Never Used  Substance and Sexual Activity  . Alcohol use: Yes    Alcohol/week: 0.0 oz    Comment: occasional beer  . Drug use: Yes    Types: Marijuana  . Sexual activity: Yes    Birth control/protection: None  Other Topics Concern  . Not on file  Social History Narrative  . Not on file        Objective:   Physical Exam  General: AAO x3, NAD  Dermatological: There is hyperkeratotic tissue present the dorsal lateral aspect of the right fifth toe at the level of the IPJ.  There is also evidence of epidermal lysis of the toe from reasonably in the corn pads on the skin.  There is no ulceration present.  There is no swelling or redness to the area.  There is no open lesions present.  Vascular: Dorsalis Pedis artery and Posterior Tibial artery pedal pulses are 2/4 bilateral with immedate capillary fill time. Pedal hair growth  present. No varicosities and no lower extremity edema present bilateral. There is no pain with calf compression, swelling, warmth, erythema.   Neruologic: Grossly intact via light touch bilateral. Protective threshold with Semmes Wienstein monofilament intact to all pedal sites bilateral.   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  There is no other areas of tenderness identified at this time.  Muscular strength 5/5 in all groups tested bilateral.  Gait: Unassisted, Nonantalgic.     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  No evidence of acute fracture  identified.  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 -I debrided the hyperkeratotic tissue today without any complications or bleeding.  I dispensed offloading pads.  We also discussed conservative treatment as well as surgical intervention.  Discussed shoe gear modifications and offloading.  If symptoms continue discussed surgical intervention.  Likely needs an IPJ arthroplasty of the DIPJ should he undergo surgical intervention.  Vivi BarrackMatthew R Torunn Chancellor DPM

## 2017-09-30 ENCOUNTER — Telehealth: Payer: Self-pay | Admitting: Internal Medicine

## 2017-09-30 NOTE — Telephone Encounter (Signed)
Patient aware samples are at the front desk for pick up  

## 2017-09-30 NOTE — Telephone Encounter (Signed)
New Message ° °Patient calling the office for samples of medication: ° ° °1.  What medication and dosage are you requesting samples for? Xarelto 20mg ° °2.  Are you currently out of this medication? yes ° ° ° ° °

## 2017-10-01 ENCOUNTER — Ambulatory Visit: Payer: BLUE CROSS/BLUE SHIELD | Admitting: Podiatry

## 2017-10-17 ENCOUNTER — Telehealth: Payer: Self-pay | Admitting: Internal Medicine

## 2017-10-17 NOTE — Telephone Encounter (Signed)
Medication Samples have been provided to the patient.  Drug name: Nat MathXarelot 20mg Qty: 21LOT: [id number].Date: 3/21  The patient has been instructed regarding the correct time, dose, and frequency of taking this medication, including desired effects and most common side effects.

## 2017-10-17 NOTE — Telephone Encounter (Signed)
Patient calling the office for samples of medication:   1.  What medication and dosage are you requesting samples for? Xarelto 20 mg  2.  Are you currently out of this medication? Few pills left

## 2017-11-07 ENCOUNTER — Telehealth: Payer: Self-pay | Admitting: Internal Medicine

## 2017-11-07 MED ORDER — RIVAROXABAN 20 MG PO TABS: ORAL_TABLET | ORAL | 0 refills | Status: DC

## 2017-11-07 NOTE — Telephone Encounter (Signed)
PAT AWARE. SAMPLES AVAILABLE X 2  TO BE PICKED UP

## 2017-11-07 NOTE — Telephone Encounter (Signed)
New message ° ° ° °Patient calling the office for samples of medication: ° ° °1.  What medication and dosage are you requesting samples for?rivaroxaban (XARELTO) 20 MG TABS tablet ° °2.  Are you currently out of this medication? yes ° ° °

## 2017-11-21 ENCOUNTER — Telehealth: Payer: Self-pay | Admitting: Internal Medicine

## 2017-11-21 NOTE — Telephone Encounter (Signed)
Patient calling the office for samples of medication:   1.  What medication and dosage are you requesting samples for?Xarelto   2.  Are you currently out of this medication? Only have a few left

## 2017-11-21 NOTE — Telephone Encounter (Signed)
Xarelto 20 mg #3 lot #[id number] exp 3/21 Advised patient and scheduled follow up ov

## 2017-12-03 ENCOUNTER — Other Ambulatory Visit: Payer: Self-pay | Admitting: Internal Medicine

## 2017-12-03 MED ORDER — RIVAROXABAN 20 MG PO TABS: ORAL_TABLET | ORAL | 0 refills | Status: DC

## 2017-12-03 NOTE — Telephone Encounter (Signed)
LEFT MESSAGE SAMPLES ARE AVAILABLE FOR PICK UP

## 2017-12-03 NOTE — Telephone Encounter (Signed)
Patient calling the office for samples of medication: ° ° °1.  What medication and dosage are you requesting samples for? Xarelto ° °2.  Are you currently out of this medication?  A few left ° ° °

## 2017-12-16 ENCOUNTER — Telehealth: Payer: Self-pay | Admitting: Internal Medicine

## 2017-12-16 NOTE — Telephone Encounter (Signed)
New message ° ° ° °Patient calling the office for samples of medication: ° ° °1.  What medication and dosage are you requesting samples for?rivaroxaban (XARELTO) 20 MG TABS tablet ° °2.  Are you currently out of this medication? yes ° ° °

## 2017-12-16 NOTE — Telephone Encounter (Signed)
Medication Samples have been provided to the patient. Patient has been made aware.   Drug name: Xarelto       Strength: 20 mg         Qty: 2 bottles  LOT: [id number]  Exp.Date: 2/21

## 2017-12-26 ENCOUNTER — Telehealth: Payer: Self-pay | Admitting: Internal Medicine

## 2017-12-26 NOTE — Telephone Encounter (Signed)
Medication samples have been provided to the patient.  Drug name: Xarelto 20 mg   Qty: 21 tablets    LOT: [id number]  Exp.Date: 02/21  Samples left at front desk for patient pick-up. Patient notified.  

## 2017-12-26 NOTE — Telephone Encounter (Signed)
New Message ° ° ° °Patient calling the office for samples of medication: ° ° °1.  What medication and dosage are you requesting samples for? rivaroxaban (XARELTO) 20 MG TABS tablet ° °2.  Are you currently out of this medication? yes ° ° ° °

## 2018-01-13 ENCOUNTER — Telehealth: Payer: Self-pay | Admitting: Internal Medicine

## 2018-01-13 MED ORDER — RIVAROXABAN 20 MG PO TABS: ORAL_TABLET | ORAL | 0 refills | Status: DC

## 2018-01-13 NOTE — Telephone Encounter (Signed)
Patient calling the office for samples of medication:   1.  What medication and dosage are you requesting samples for? Xarelto 20 mg  2.  Are you currently out of this medication? Yes  :  

## 2018-02-10 ENCOUNTER — Ambulatory Visit: Payer: BLUE CROSS/BLUE SHIELD | Admitting: Internal Medicine

## 2018-02-10 ENCOUNTER — Telehealth: Payer: Self-pay

## 2018-02-10 NOTE — Telephone Encounter (Signed)
   [facility] Pre-operative Risk Assessment    Request for surgical clearance:  1. What type of surgery is being performed? Colonoscopy   2. When is this surgery scheduled? 02/25/18  3. What type of clearance is required (medical clearance vs. Pharmacy clearance to hold med vs. Both)? Both  4. Are there any medications that need to be held prior to surgery and how long? Xarelto hold 2 days prior  5. Practice name and name of physician performing surgery? [facility]  6. What is your office phone and fax number? [phone] Fax: [phone]  7. Anesthesia type (None, local, MAC, general) ? Unknown   Kyle Ryan 02/10/2018, 4:26 PM  _________________________________________________________________   (provider comments below)

## 2018-02-12 ENCOUNTER — Telehealth: Payer: Self-pay | Admitting: Internal Medicine

## 2018-02-12 NOTE — Telephone Encounter (Signed)
Patient with diagnosis of atrial fibrillation on Xarelto for anticoagulation.    Procedure: colonoscopy Date of procedure: 02/25/18  CHADS2-VASc score of  3 (, HTN,  stroke/tia x 2 - DVT , )  CrCl 108.9 Platelet count 158 (drawn 09/2013)  Per office protocol, patient can hold Xarelto for 1 day prior to procedure.    Patient should restart Xarelto on the evening of procedure or day after, at discretion of procedure MD.

## 2018-02-12 NOTE — Telephone Encounter (Signed)
Patient made aware that samples are available at the front.  Medication Samples have been provided to the patient.  Drug name: Xarelto       Strength: 20 mg        Qty: 3 bottles  LOT: [id number]  Exp.Date: 2/21

## 2018-02-12 NOTE — Telephone Encounter (Signed)
Patient calling the office for samples of medication: ° ° °1.  What medication and dosage are you requesting samples for? Xarelto ° °2.  Are you currently out of this medication?  A few ° ° ° °

## 2018-02-13 NOTE — Telephone Encounter (Addendum)
   Primary Cardiologist: Chrystie NoseKenneth C Hilty, MD  Chart reviewed as part of pre-operative protocol coverage. Given past medical history and time since last visit, based on ACC/AHA guidelines, Kyle Ryan would be at acceptable risk for the planned procedure without further cardiovascular testing.   Pt may only hold xarelto for only  24 hours prior to procedure, he has hx of DVT and atrial fib.  He should resume as soon as possible afterward.    I will route this recommendation to the requesting party via Epic fax function and remove from pre-op pool.  Please call with questions.  Nada BoozerLaura Ingold, NP 02/13/2018, 2:14 PM

## 2018-03-03 ENCOUNTER — Telehealth: Payer: Self-pay | Admitting: Internal Medicine

## 2018-03-03 NOTE — Telephone Encounter (Signed)
Patient made aware that the samples are available at the front for him.  Medication Samples have been provided to the patient.  Drug name: Xarelto       Strength: 20 mg        Qty: 3 bottles  LOT: [id number] Exp.Date: 4/21

## 2018-03-03 NOTE — Telephone Encounter (Signed)
New Message   Patient calling the office for samples of medication:   1.  What medication and dosage are you requesting samples for? Xarelto s   2.  Are you currently out of this medication? No, only have 3 remaining.

## 2018-03-19 ENCOUNTER — Encounter: Payer: Self-pay | Admitting: Internal Medicine

## 2018-03-19 ENCOUNTER — Ambulatory Visit (INDEPENDENT_AMBULATORY_CARE_PROVIDER_SITE_OTHER): Payer: BLUE CROSS/BLUE SHIELD | Admitting: Internal Medicine

## 2018-03-19 VITALS — BP 118/69 | HR 52 | Ht 77.0 in | Wt 221.6 lb

## 2018-03-19 DIAGNOSIS — I480 Paroxysmal atrial fibrillation: Secondary | ICD-10-CM | POA: Diagnosis not present

## 2018-03-19 DIAGNOSIS — Z86718 Personal history of other venous thrombosis and embolism: Secondary | ICD-10-CM

## 2018-03-19 DIAGNOSIS — Z8249 Family history of ischemic heart disease and other diseases of the circulatory system: Secondary | ICD-10-CM

## 2018-03-19 DIAGNOSIS — R0602 Shortness of breath: Secondary | ICD-10-CM | POA: Insufficient documentation

## 2018-03-19 DIAGNOSIS — E785 Hyperlipidemia, unspecified: Secondary | ICD-10-CM | POA: Insufficient documentation

## 2018-03-19 NOTE — Progress Notes (Addendum)
OFFICE NOTE  Chief Complaint:  No complaints  Primary Care Physician: Darrow Bussing, MD  HPI:  Kyle Ryan is a 57 year old gentleman who I have been following for atrial fibrillation, which he has had for about 2 years paroxysmally. He recently developed DVT, was placed on Xarelto, and was set up for cardioversion; however, spontaneously converted to sinus and never had that. He was at risk for post-thrombotic syndrome and I recommended wearing a lower extremity compression stocking, which has helped with his swelling, and he continues to wear that today. Unfortunately, repeat Dopplers on January 07, 2012, showed persistent DVT in the femoral and popliteal veins with recanalization noted at the mid femoral level, consistent with a chronic DVT. At this point, we discussed staying on the Xarelto versus coming off to go on aspirin. He is on aspirin as well, and for now I would recommend staying on that for at least 6 months and perhaps consider repeating Dopplers at that time. Recently he underwent colonoscopy and required bridging therapy but did well with that. There were no bleeding complications. He reports continued swelling without the use of a stocking, suggestive of post-thrombotic syndrome. Incidentally, he was noted to be in atrial fibrillation today, however he is unaware of this. He has had this intermittently, to my knowledge, since 2010.  Kyle Ryan today for a urgent visit that is preoperative. He recently called the office for preoperative clearance prior to an upcoming knee surgery. It was requested that he hold his Xarelto for a period of time which was longer than typical however this has concerned him about possibly exposing it to increased risk of blood clots. He is reporting significant anxiety about the surgery and feels that he's been having palpitations. In the office today his EKG shows PVCs which are new. He does have a history of some PVCs in the past which was  in the setting of atrial fibrillation however his rhythm is sinus today. He does not report any recent excessive alcohol use. He denies any chest pain.  I saw Kyle Ryan in the office today. Overall he is doing very well. He is on long-term Xarelto for chronic DVT. Recently his primary care provider changes medications including switching lisinopril to valsartan and for possible side effects. He seems to be tolerating that well. His Toprol was also increased from 25-50 mg daily. Blood pressure is fairly well-controlled today. He denies any adverse bleeding on Xarelto.  01/04/2017  Kyle Ryan today for follow-up. He reports feeling fairly well. He denies any significant chest pain or worsening shortness of breath. He's had no problems on Xarelto and no recurrent DVT. Blood pressure is well-controlled today. He remains physically active and maintains a fairly good weight.  03/19/2018  Kyle Ryan today for follow-up.  He does say over the past year he has had some progressive worsening of shortness of breath, particularly with exertion.  He is noted some family history of heart disease and is concerned that he may be having heart disease as well.  He denies any A. fib that he is aware of.  He remains on Xarelto 20 mg for both A. fib and history of recurrent DVT.  He also has dyslipidemia and hypertension, but no history of diabetes.  He had lab work through his PCP in March 2019, total cholesterol was 164, HDL 55, LDL 88 and triglycerides 102.  I explained to him that his goal LDL should be less than 70, likely given  his risk factors and if we proceed with cardiac CT and demonstrate coronary disease that will likely be his target.  We may need to add additional medication such as ezetimibe as he already eats a low-fat diet.  PMHx:  Past Medical History:  Diagnosis Date  . DVT (deep venous thrombosis) (HCC) 10/04/2011   on Xarelto  . ED (erectile dysfunction)   . Hypertension   .  Limb pain    LEA VENOUS DUPLEX, 12/31/2011 - positive DVT in femoral and popliteal veins  . PAF (paroxysmal atrial fibrillation) (HCC)     Past Surgical History:  Procedure Laterality Date  . arthroscopy of knee      FAMHx:  Family History  Problem Relation Age of Onset  . Diabetes Mother        Type II  . Hypertension Mother   . Stroke Mother   . Arrhythmia Mother   . Hypertension Brother   . Hypertension Sister   . Diabetes Sister        Type II    SOCHx:   reports that he quit smoking about 6 years ago. He has never used smokeless tobacco. He reports that he drinks alcohol. He reports that he has current or past drug history. Drug: Marijuana.  ALLERGIES:  Allergies  Allergen Reactions  . Flagyl [Metronidazole Hcl] Rash    ROS: A comprehensive review of systems was negative.  HOME MEDS: Current Outpatient Medications  Medication Sig Dispense Refill  . amLODipine-valsartan (EXFORGE) 10-160 MG tablet Take 1 tablet by mouth daily.  0  . atorvastatin (LIPITOR) 20 MG tablet   11  . metoprolol succinate (TOPROL-XL) 50 MG 24 hr tablet Take 50 mg by mouth daily. Take with or immediately following a meal.    . Tamsulosin HCl (FLOMAX) 0.4 MG CAPS Take 0.4 mg by mouth daily after supper.       No current facility-administered medications for this visit.     LABS/IMAGING: No results found for this or any previous visit (from the past 48 hour(s)). No results found.  VITALS: BP 118/69   Pulse (!) 52   Ht  (1.956 m)   Wt 221 lb 9.6 oz (100.5 kg)   BMI 26.28 kg/m   EXAM: General appearance: alert and no distress Neck: no adenopathy, no carotid bruit, no JVD, supple, symmetrical, trachea midline and thyroid not enlarged, symmetric, no tenderness/mass/nodules Lungs: clear to auscultation bilaterally Heart: irregularly irregular rhythm Abdomen: soft, non-tender; bowel sounds normal; no masses,  no organomegaly Extremities: edema Right lower extremity swelling,  stocking in place Pulses: 2+ and symmetric Skin: Skin color, texture, turgor normal. No rashes or lesions Neurologic: Grossly normal  EKG: Sinus bradycardia at 52, inferior T wave inversions consistent with ischemia- Personally reviewed  ASSESSMENT: 1. Progressive dyspnea on exertion-?  Anginal equivalent 2. Symptomatic PVC's/PACs - resolved with increased B=blocker 3. Right DVT, unresolved, on chronic Xarelto therapy 4. 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 HTN - controlled 6. PAF - on xarelto  PLAN: 1.   Mr. Lepore has had some progressive dyspnea on exertion and wonders if this could be an anginal equivalent.  His family history of heart disease.  He reports compliance with Xarelto therapy.  There are mild EKG changes including T wave abnormalities inferiorly.  I am recommending a CT coronary angiogram to further evaluate for possible obstructive coronary disease.  The study will be with fractional flow reserve (FFR) to demonstrate any physiologic significance if noted.  Plan follow-up with me afterwards to  discuss those results.  Chrystie Nose, MD, [facility] Attending Cardiologist [facility]  Chrystie Nose 03/19/2018, 2:38 PM

## 2018-03-19 NOTE — Patient Instructions (Addendum)
Your physician has requested that you have coronary CT. Cardiac computed tomography (CT) is a painless test that uses an x-ray machine to take clear, detailed pictures of your heart. Please follow instruction sheet as given.  Your physician recommends that you schedule a follow-up appointment after your test (OK to double book)  ** xarelto  samples provided   Please arrive at the [facility] main entrance of [facility] at xx:xx AM (30-45 minutes prior to test start time)  [facility] [address] [place], [place] [address] [phone]  Proceed to the [facility] Radiology Department (First Floor).  Please follow these instructions carefully (unless otherwise directed):  BLOOD WORK - 1 week prior to test  Hold all erectile dysfunction medications at least 48 hours prior to test.  On the Night Before the Test: . Drink plenty of water. . Do not consume any caffeinated/decaffeinated beverages or chocolate 12 hours prior to your test. . Do not take any antihistamines 12 hours prior to your test.   On the Day of the Test: . Drink plenty of water. Do not drink any water within one hour of the test. . Do not eat any food 4 hours prior to the test. . You may take your regular medications prior to the test.  After the Test: . Drink plenty of water. . After receiving IV contrast, you may experience a mild flushed feeling. This is normal. . On occasion, you may experience a mild rash up to 24 hours after the test. This is not dangerous. If this occurs, you can take Benadryl 25 mg and increase your fluid intake. . If you experience trouble breathing, this can be serious. If it is severe call 911 IMMEDIATELY. If it is mild, please call our office. . If you take any of these medications: Glipizide/Metformin, Avandament, Glucavance, please do not take 48 hours after completing test.

## 2018-03-27 ENCOUNTER — Telehealth: Payer: Self-pay | Admitting: Internal Medicine

## 2018-03-27 NOTE — Telephone Encounter (Signed)
Called patient, patient aware of instructions that were given on AVS for CT.   Patient also aware to have BMET completed prior.   Patient also wondering about how much he is going to be responsible for.   Advised I would reach out to billing but this may have to be directed to insurance company.    Patient aware and verbalized understanding.

## 2018-03-27 NOTE — Telephone Encounter (Signed)
New message     Patient cardiac ct is scheduled for April 06, 2018 830a he needs instructions mailed to him please

## 2018-04-08 ENCOUNTER — Telehealth: Payer: Self-pay | Admitting: Internal Medicine

## 2018-04-08 NOTE — Telephone Encounter (Signed)
Medication samples have been provided to the patient.  Drug name: Xarelto 20 mg  Qty: 14     LOT: [id number]  Exp.Date: 02/21  Samples left at front desk for patient pick-up. Patient notified.    Patient also states he has a schedule conflict and needs her coronary CTA rescheduled.  Advised I would reach out to scheduler to have rescheduled.

## 2018-04-08 NOTE — Telephone Encounter (Signed)
Pt calling'   Patient calling the office for samples of medication:   1.  What medication and dosage are you requesting samples for? Xarelto 20mg   2.  Are you currently out of this medication? No

## 2018-04-25 ENCOUNTER — Ambulatory Visit (HOSPITAL_COMMUNITY): Payer: BLUE CROSS/BLUE SHIELD

## 2018-05-21 ENCOUNTER — Other Ambulatory Visit: Payer: Self-pay | Admitting: Family Medicine

## 2018-05-21 ENCOUNTER — Ambulatory Visit
Admission: RE | Admit: 2018-05-21 | Discharge: 2018-05-21 | Disposition: A | Discharge status: Home / Self Care | Payer: BLUE CROSS/BLUE SHIELD | Source: Ambulatory Visit | Attending: Family Medicine | Admitting: Family Medicine

## 2018-05-21 DIAGNOSIS — M25551 Pain in right hip: Secondary | ICD-10-CM

## 2018-05-22 ENCOUNTER — Telehealth: Payer: Self-pay | Admitting: Internal Medicine

## 2018-05-22 NOTE — Telephone Encounter (Signed)
Left message for patient to call back to see why patient needs to continue to get samples.  Patient has received samples for Xarelto on 03/19/18 and 04/08/18. Will have Dr. Blanchie DessertHilty's nurse follow up to see if they want to keep giving out samples every month or see if patient can get assistance.

## 2018-05-22 NOTE — Telephone Encounter (Signed)
New Message ° ° ° °Patient calling the office for samples of medication: ° ° °1.  What medication and dosage are you requesting samples for? rivaroxaban (XARELTO) 20 MG TABS tablet ° °2.  Are you currently out of this medication? yes ° ° ° °

## 2018-05-23 NOTE — Telephone Encounter (Signed)
Patient returned call. He has a copay card but med still costs him $200+.   Medication samples have been provided to the patient.  Drug name: xarelto 20mg   Qty: 4 bottles  LOT: [id number]  Exp.Date: 04/2020  Samples left at front desk for patient pick-up. Patient notified.  Lindell Sparlkins, Joniel Graumann M 10:57 AM 05/23/2018

## 2018-05-28 ENCOUNTER — Telehealth: Payer: Self-pay | Admitting: Internal Medicine

## 2018-05-28 NOTE — Telephone Encounter (Signed)
Spoke with pt who state he was going out town at the time someone called to schedule his Coronary CT. He states he is now ready to be scheduled. Message sent to scheduler.

## 2018-05-28 NOTE — Telephone Encounter (Signed)
New Message    Patient is calling because he was to have a procedure down at the hospital. He wants to know if Dr. Rennis GoldenHilty still wants him to have the procedure. I believe he is talking about the CT but he wants to be sure that is the test. Please call.

## 2018-05-29 ENCOUNTER — Other Ambulatory Visit: Payer: Self-pay | Admitting: *Deleted

## 2018-05-29 DIAGNOSIS — I10 Essential (primary) hypertension: Secondary | ICD-10-CM

## 2018-05-29 DIAGNOSIS — R0602 Shortness of breath: Secondary | ICD-10-CM

## 2018-05-29 DIAGNOSIS — E785 Hyperlipidemia, unspecified: Secondary | ICD-10-CM

## 2018-05-29 DIAGNOSIS — Z8249 Family history of ischemic heart disease and other diseases of the circulatory system: Secondary | ICD-10-CM

## 2018-05-29 DIAGNOSIS — I480 Paroxysmal atrial fibrillation: Secondary | ICD-10-CM

## 2018-06-16 ENCOUNTER — Telehealth: Payer: Self-pay | Admitting: Internal Medicine

## 2018-06-16 MED ORDER — RIVAROXABAN 20 MG PO TABS: 20.0000 mg | ORAL_TABLET | Freq: Every day | ORAL | 0 refills | Status: DC

## 2018-06-16 NOTE — Telephone Encounter (Signed)
Spoke to patient . Samples are ready for pick up

## 2018-06-16 NOTE — Telephone Encounter (Signed)
Patient calling the office for samples of medication: ° ° °1.  What medication and dosage are you requesting samples for? Xarelto 20 mg ° °2.  Are you currently out of this medication? No ° ° °

## 2018-06-25 ENCOUNTER — Telehealth: Payer: Self-pay | Admitting: Internal Medicine

## 2018-06-25 NOTE — Telephone Encounter (Signed)
New message °Patient calling the office for samples of medication: ° ° °1.  What medication and dosage are you requesting samples for?rivaroxaban (XARELTO) 20 MG TABS tablet ° °2.  Are you currently out of this medication? no ° ° °

## 2018-06-25 NOTE — Telephone Encounter (Signed)
Called to notify patient that we were currently out of samples, but to call back in a few days to see if it was restocked.   Left a message, with call back number.

## 2018-06-30 ENCOUNTER — Telehealth: Payer: Self-pay | Admitting: Internal Medicine

## 2018-06-30 NOTE — Telephone Encounter (Signed)
Medication given: Xarelto 20mg  Lot #: [id number] Expiration: 06/21 Qty: 3 bottles  Called patient, LVM notified to come to office to pick up samples.

## 2018-06-30 NOTE — Telephone Encounter (Signed)
New message: ° ° ° ° ° °Patient calling the office for samples of medication: ° ° °1.  What medication and dosage are you requesting samples for? Xarelto ° °2.  Are you currently out of this medication? Yes ° ° °

## 2018-07-22 ENCOUNTER — Other Ambulatory Visit: Payer: Self-pay | Admitting: Internal Medicine

## 2018-07-22 NOTE — Telephone Encounter (Signed)
Medication samples have been provided to the patient.  Drug name: Xarelto 20 mg  Qty: 28  . LOT: [id number]  Exp.Date: 06/21  Samples left at front desk for patient pick-up. Patient notified.

## 2018-07-22 NOTE — Telephone Encounter (Signed)
New Message:   Patient calling he would like more samples of 20 mg Xarelto. Please call Patient back.

## 2018-08-06 ENCOUNTER — Other Ambulatory Visit: Payer: Self-pay | Admitting: *Deleted

## 2018-08-06 DIAGNOSIS — Z8249 Family history of ischemic heart disease and other diseases of the circulatory system: Secondary | ICD-10-CM

## 2018-08-06 DIAGNOSIS — R0602 Shortness of breath: Secondary | ICD-10-CM

## 2018-08-06 DIAGNOSIS — E785 Hyperlipidemia, unspecified: Secondary | ICD-10-CM

## 2018-08-26 ENCOUNTER — Telehealth (HOSPITAL_COMMUNITY): Payer: Self-pay | Admitting: Internal Medicine

## 2018-08-26 NOTE — Telephone Encounter (Signed)
New message   Patient calling the office for samples of medication:   1.  What medication and dosage are you requesting samples for? xarelto  20 mg   2.  Are you currently out of this medication? No

## 2018-08-26 NOTE — Telephone Encounter (Signed)
Called patient and notified that samples of medication were placed upfront.   Medication given: Xarelto 20 mg Lot #: [id number] Expiration: 04/2020 Qty: 4 bottles

## 2018-09-08 ENCOUNTER — Telehealth: Payer: Self-pay | Admitting: Internal Medicine

## 2018-09-08 MED ORDER — RIVAROXABAN 20 MG PO TABS: 20.0000 mg | ORAL_TABLET | Freq: Every day | ORAL | 0 refills | Status: DC

## 2018-09-08 NOTE — Telephone Encounter (Signed)
SPOKE TO PATIENT . SAMPLES (3)  AVAILABLE FOR PICK UP.VERBALIZED UNDERSTANDING

## 2018-09-08 NOTE — Telephone Encounter (Signed)
Patient calling the office for samples of medication: ° ° °1.  What medication and dosage are you requesting samples for? rivaroxaban (XARELTO) 20 MG TABS tablet ° °2.  Are you currently out of this medication? yes ° ° °

## 2018-09-18 ENCOUNTER — Telehealth: Payer: Self-pay | Admitting: Internal Medicine

## 2018-09-18 NOTE — Telephone Encounter (Signed)
   [facility] Pre-operative Risk Assessment    Request for surgical clearance:  1. What type of surgery is being performed? Right total hip replacement    2. When is this surgery scheduled? 10/21/18   3. What type of clearance is required (medical clearance vs. Pharmacy clearance to hold med vs. Both)? Both   4. Are there any medications that need to be held prior to surgery and how long? Xarelto   5. Practice name and name of physician performing surgery? Dr. Burnis Kingfisher @ [facility])   6. What is your office phone number [phone]    7.   What is your office fax number [phone]  8.   Anesthesia type (None, local, MAC, general) ? Not specified    Kyle Ryan 09/18/2018, 4:22 PM  _________________________________________________________________   (provider comments below)

## 2018-09-18 NOTE — Telephone Encounter (Signed)
   Primary Cardiologist:Kenneth C Hilty, MD  Chart reviewed as part of pre-operative protocol coverage. Because of Forrest Moronndy R Hurlbut's past medical history and time since last visit, he/she will require a follow-up visit in order to better assess preoperative cardiovascular risk.  Pre-op covering staff: - Please schedule appointment and call patient to inform them. - Please contact requesting surgeon's office via preferred method (i.e, phone, fax) to inform them of need for appointment prior to surgery.  If applicable, this message will also be routed to pharmacy pool and/or primary cardiologist for input on holding anticoagulant/antiplatelet agent as requested below so that this information is available at time of patient's appointment.   Nada BoozerLaura Ingold, NP  09/18/2018, 5:02 PM

## 2018-09-18 NOTE — Telephone Encounter (Signed)
Pharm please address Xarelto thanks 

## 2018-09-19 MED ORDER — RIVAROXABAN 20 MG PO TABS: 20.0000 mg | ORAL_TABLET | Freq: Every day | ORAL | 6 refills | Status: DC

## 2018-09-19 NOTE — Addendum Note (Signed)
Addended by: SUPPLE, MEGAN E on: 09/19/2018 02:31 PM   Modules accepted: Orders

## 2018-09-19 NOTE — Telephone Encounter (Signed)
Per Pre Op Protocol pt is now scheduled to see Dr. Rennis GoldenHilty 09/24/18 9:15 . I will remove from the pool.

## 2018-09-19 NOTE — Telephone Encounter (Signed)
Pt takes Xarelto for chronic DVT and history of afib with CHADS2VASc score of 1 (HTN). Typically hold Xarelto for 3 days prior to THA due to spinal anesthesia that's used. With history of chronic DVT will defer to managing MD regarding appropriate length of time off anticoagulation prior to THA.  Also noticed that pt calls clinic monthly for Xarelto samples. He has Nurse, learning disabilitycommercial insurance so he should not need samples. Called pharmacy and sent in new rx, his copay is $10 without even using a copay card. Not sure why rx was never sent to pharmacy. Please advise pt of his copay, [facility].

## 2018-09-23 ENCOUNTER — Telehealth: Payer: Self-pay | Admitting: Internal Medicine

## 2018-09-23 NOTE — Telephone Encounter (Signed)
New Message   Patient calling the office for samples of medication:   1.  What medication and dosage are you requesting samples for? rivaroxaban (XARELTO) 20 MG TABS tablet  2.  Are you currently out of this medication? Two days left

## 2018-09-23 NOTE — Telephone Encounter (Signed)
Returned call to patient Xarelto 20 mg samples left at [facility] office front desk. 

## 2018-09-24 ENCOUNTER — Telehealth: Payer: Self-pay | Admitting: Internal Medicine

## 2018-09-24 ENCOUNTER — Encounter: Payer: Self-pay | Admitting: Internal Medicine

## 2018-09-24 ENCOUNTER — Ambulatory Visit (INDEPENDENT_AMBULATORY_CARE_PROVIDER_SITE_OTHER): Payer: BLUE CROSS/BLUE SHIELD | Admitting: Internal Medicine

## 2018-09-24 VITALS — BP 112/72 | HR 57 | Ht 77.0 in | Wt 224.0 lb

## 2018-09-24 DIAGNOSIS — R0602 Shortness of breath: Secondary | ICD-10-CM

## 2018-09-24 DIAGNOSIS — Z01810 Encounter for preprocedural cardiovascular examination: Secondary | ICD-10-CM | POA: Diagnosis not present

## 2018-09-24 DIAGNOSIS — I480 Paroxysmal atrial fibrillation: Secondary | ICD-10-CM

## 2018-09-24 DIAGNOSIS — I10 Essential (primary) hypertension: Secondary | ICD-10-CM | POA: Diagnosis not present

## 2018-09-24 MED ORDER — RIVAROXABAN 20 MG PO TABS: 20.0000 mg | ORAL_TABLET | Freq: Every day | ORAL | 6 refills | Status: AC

## 2018-09-24 NOTE — Telephone Encounter (Signed)
Patient was in office today 09/24/18. Explained that per Margaretmary DysMegan Supple, RPH note in 09/18/18 telephone encounter, patient can obtain Rx for xarelto for $10/month from pharmacy. This was explained to patient and a co-pay card was provided.

## 2018-09-24 NOTE — Progress Notes (Signed)
OFFICE NOTE  Chief Complaint:  Preoperative risk assessment  Primary Care Physician: Darrow BussingKoirala, Dibas, MD  HPI:  Kyle Ryan is a 57 year old gentleman who I have been following for atrial fibrillation, which he has had for about 2 years paroxysmally. He recently developed DVT, was placed on Xarelto, and was set up for cardioversion; however, spontaneously converted to sinus and never had that. He was at risk for post-thrombotic syndrome and I recommended wearing a lower extremity compression stocking, which has helped with his swelling, and he continues to wear that today. Unfortunately, repeat Dopplers on January 07, 2012, showed persistent DVT in the femoral and popliteal veins with recanalization noted at the mid femoral level, consistent with a chronic DVT. At this point, we discussed staying on the Xarelto versus coming off to go on aspirin. He is on aspirin as well, and for now I would recommend staying on that for at least 6 months and perhaps consider repeating Dopplers at that time. Recently he underwent colonoscopy and required bridging therapy but did well with that. There were no bleeding complications. He reports continued swelling without the use of a stocking, suggestive of post-thrombotic syndrome. Incidentally, he was noted to be in atrial fibrillation today, however he is unaware of this. He has had this intermittently, to my knowledge, since 2010.  Mr. Kyle Ryan returns today for a urgent visit that is preoperative. He recently called the office for preoperative clearance prior to an upcoming knee surgery. It was requested that he hold his Xarelto for a period of time which was longer than typical however this has concerned him about possibly exposing it to increased risk of blood clots. He is reporting significant anxiety about the surgery and feels that he's been having palpitations. In the office today his EKG shows PVCs which are new. He does have a history of some PVCs in the  past which was in the setting of atrial fibrillation however his rhythm is sinus today. He does not report any recent excessive alcohol use. He denies any chest pain.  I saw Mr. Kyle Ryan back in the office today. Overall he is doing very well. He is on long-term Xarelto for chronic DVT. Recently his primary care provider changes medications including switching lisinopril to valsartan and for possible side effects. He seems to be tolerating that well. His Toprol was also increased from 25-50 mg daily. Blood pressure is fairly well-controlled today. He denies any adverse bleeding on Xarelto.  01/04/2017  Mr. Kyle Ryan returns today for follow-up. He reports feeling fairly well. He denies any significant chest pain or worsening shortness of breath. He's had no problems on Xarelto and no recurrent DVT. Blood pressure is well-controlled today. He remains physically active and maintains a fairly good weight.  03/19/2018  Mr. Kyle Ryan returns today for follow-up.  He does say over the past year he has had some progressive worsening of shortness of breath, particularly with exertion.  He is noted some family history of heart disease and is concerned that he may be having heart disease as well.  He denies any A. fib that he is aware of.  He remains on Xarelto 20 mg for both A. fib and history of recurrent DVT.  He also has dyslipidemia and hypertension, but no history of diabetes.  He had lab work through his PCP in March 2019, total cholesterol was 164, HDL 55, LDL 88 and triglycerides 102.  I explained to him that his goal LDL should be less than 70, likely  given his risk factors and if we proceed with cardiac CT and demonstrate coronary disease that will likely be his target.  We may need to add additional medication such as ezetimibe as he already eats a low-fat diet.  09/24/2018  Mr. Kyle Ryan is seen today in follow-up.  He is here for preoperative risk assessment.  I last saw him in May of this year at which time he  was complaining of worsening shortness of breath with exertion.  He was concerned about coronary disease and I ordered a CT coronary angiogram, however he never got the study scheduled.  Subsequently has been having problems with hip pain.  He apparently is now in need of a hip replacement.  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  He is planning on having surgery in early December at [facility] and for some reason had a coronary artery calcium score performed at [facility] last month which showed a coronary calcium score 17.  This was not a coronary artery CT angiogram, nevertheless is associated with a low risk prognosis.  Since then he reports he had improvement in shortness of breath.  PMHx:  Past Medical History:  Diagnosis Date  . DVT (deep venous thrombosis) (HCC) 10/04/2011   on Xarelto  . ED (erectile dysfunction)   . Hypertension   . Limb pain    LEA VENOUS DUPLEX, 12/31/2011 - positive DVT in femoral and popliteal veins  . PAF (paroxysmal atrial fibrillation) (HCC)     Past Surgical History:  Procedure Laterality Date  . arthroscopy of knee      FAMHx:  Family History  Problem Relation Age of Onset  . Diabetes Mother        Type II  . Hypertension Mother   . Stroke Mother   . Arrhythmia Mother   . Hypertension Brother   . Hypertension Sister   . Diabetes Sister        Type II    SOCHx:   reports that he quit smoking about 7 years ago. He has never used smokeless tobacco. He reports that he drinks alcohol. He reports that he has current or past drug history. Drug: Marijuana.  ALLERGIES:  Allergies  Allergen Reactions  . Flagyl [Metronidazole Hcl] Rash    ROS: Pertinent items noted in HPI and remainder of comprehensive ROS otherwise negative.  HOME MEDS: Current Outpatient Medications  Medication Sig Dispense Refill  . amLODipine-valsartan (EXFORGE)  10-160 MG tablet Take 1 tablet by mouth daily.  0  . atorvastatin (LIPITOR) 20 MG tablet   11  . metoprolol succinate (TOPROL-XL) 50 MG 24 hr tablet Take 50 mg by mouth daily. Take with or immediately following a meal.    . rivaroxaban (XARELTO) 20 MG TABS tablet Take 1 tablet (20 mg total) by mouth daily with supper. 30 tablet 6  . Tamsulosin HCl (FLOMAX) 0.4 MG CAPS Take 0.4 mg by mouth daily after supper.       No current facility-administered medications for this visit.     LABS/IMAGING: No results found for this or any previous visit (from the past 48 hour(s)). No results found.  VITALS: BP 112/72   Pulse (!) 57   Ht 6\' 5"  (1.956 m)   Wt 224 lb (101.6 kg)   BMI 26.56 kg/m   EXAM: General appearance: alert and no distress Neck: no adenopathy, no carotid bruit, no  JVD, supple, symmetrical, trachea midline and thyroid not enlarged, symmetric, no tenderness/mass/nodules Lungs: clear to auscultation bilaterally Heart: irregularly irregular rhythm Abdomen: soft, non-tender; bowel sounds normal; no masses,  no organomegaly Extremities: edema Right lower extremity swelling, stocking in place Pulses: 2+ and symmetric Skin: Skin color, texture, turgor normal. No rashes or lesions Neurologic: Grossly normal  EKG: Sinus bradycardia with PACs at 57, prolonged QTC 492 ms-personally reviewed  ASSESSMENT: 1. Acceptable risk for surgery 2. DOE improved  - CAC of 17 (2019) 3. Symptomatic PVC's/PACs - resolved with increased B=blocker 4. Right DVT, unresolved, on chronic Xarelto therapy 5. 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 HTN - controlled 7. PAF - on xarelto  PLAN: 1.   Mr. Anstead is at acceptable risk for upcoming hip surgery.  We would recommend discontinuing Xarelto 3 days prior to the procedure and then restarting it afterwards as per his orthopedist as typically anticoagulation is recommended post surgery and due to his history of chronic DVT.  He will require lifelong  anticoagulation.  He also has a history of PAF.  He had dyspnea on exertion which has improved or was improving with exercise at the [facility] until he developed hip problems.  Based on this although we had planned to do stress testing, with his symptoms improving it is unlikely that he has any critical coronary disease and the fact that he is a recent coronary artery calcium score which was low at 17 suggesting a lower risk prognosis.  I would feel he is at acceptable risk for surgery.  Follow-up with me afterwards.  Chrystie Nose, MD, [facility], FACP  [place]  [facility]  Medical Director of the Advanced Lipid Disorders &  Cardiovascular Risk Reduction Clinic Diplomate of the American Board of Clinical Lipidology Attending Cardiologist  Direct Dial: [phone]  Fax: [phone]  Website:  [email or web address] [email or web address] [email or web address] 09/24/2018, 9:35 AM

## 2018-09-24 NOTE — Patient Instructions (Signed)
Medication Instructions:  Continue current medications HOLD xarelto 3 days prior to surgery If you need a refill on your cardiac medications before your next appointment, please call your pharmacy.   Lab work: NONE If you have labs (blood work) drawn today and your tests are completely normal, you will receive your results only by: Marland Kitchen. MyChart Message (if you have MyChart) OR . A paper copy in the mail If you have any lab test that is abnormal or we need to change your treatment, we will call you to review the results.  Testing/Procedures: NONE  Follow-Up: At [facility], you and your health needs are our priority.  As part of our continuing mission to provide you with exceptional heart care, we have created designated Provider Care Teams.  These Care Teams include your primary Cardiologist (physician) and Advanced Practice Providers (APPs -  Physician Assistants and Nurse Practitioners) who all work together to provide you with the care you need, when you need it. You will need a follow up appointment in 6 months.  Please call our office 2 months in advance to schedule this appointment.  You may see Kyle NoseKenneth C Hilty, MD or one of the following Advanced Practice Providers on your designated Care Team: Kyle Ryan, [place]PA-C . Kyle FlesherAngela Duke, PA-C  Any Other Special Instructions Will Be Listed Below (If Applicable).

## 2018-10-23 IMAGING — CR DG HIP (WITH OR WITHOUT PELVIS) 2-3V*R*
2 series · 2 of 2 positions shown · non-contrast
Comparison: None.

CLINICAL DATA: Right hip pain for 2 months.

EXAM:
DG HIP (WITH OR WITHOUT PELVIS) 2-3V RIGHT

[t hip ap right]
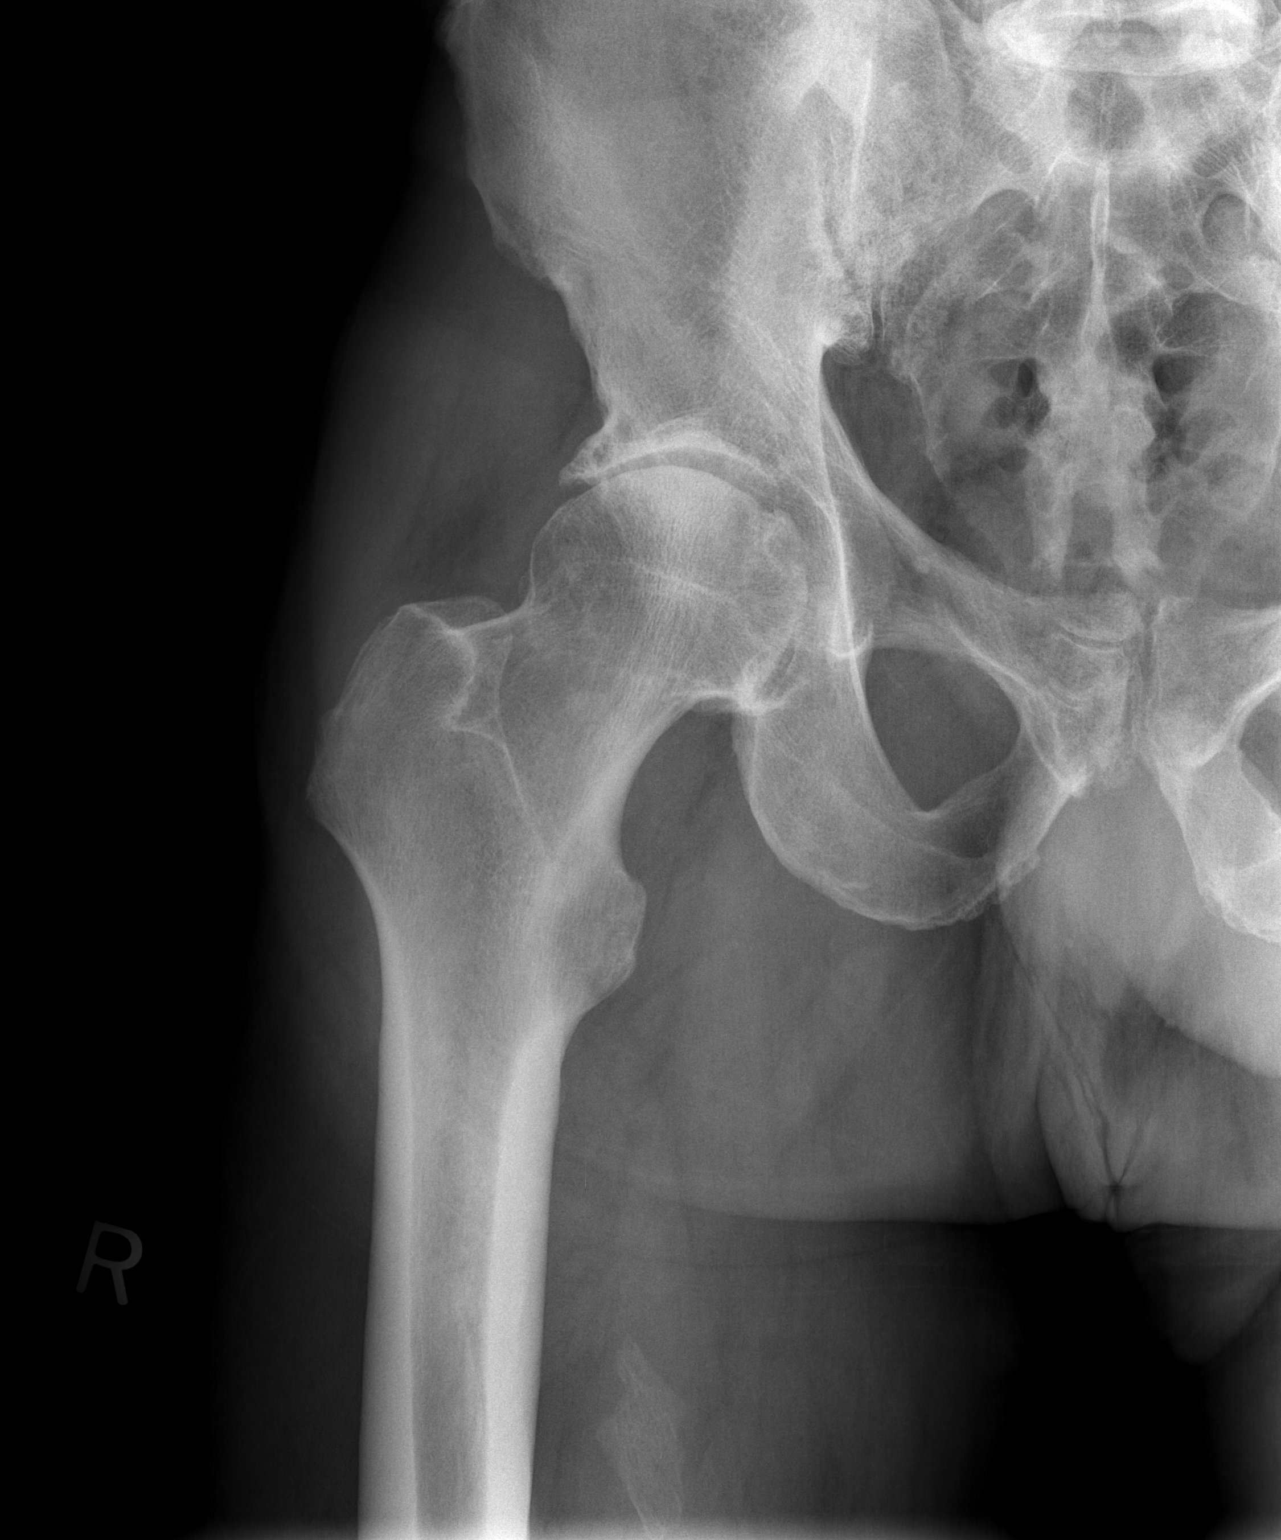

[t hip frog leg right]
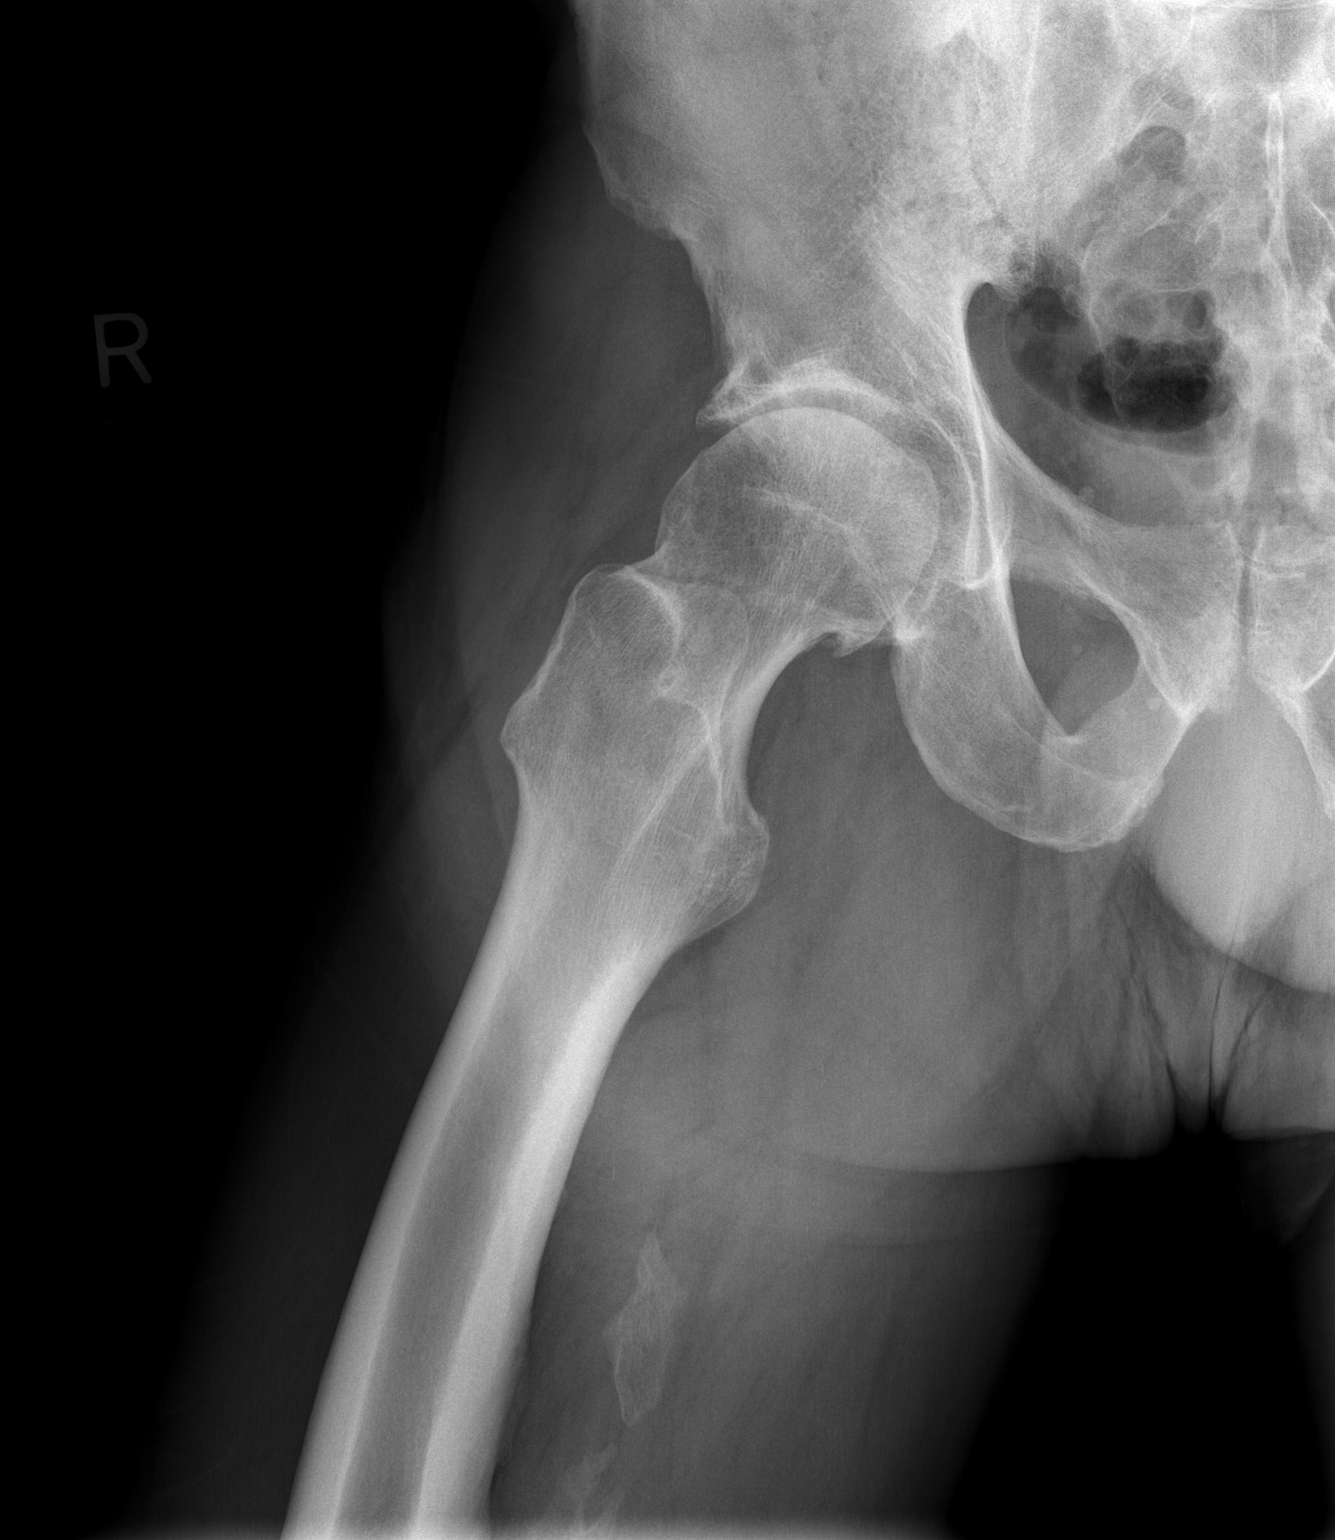

[2 of 2 positions shown; findings below may reference images not displayed]

FINDINGS: Age advanced right hip joint degenerative changes with superolateral
joint space narrowing, osteophytic spurring and subchondral cystic
change. No fracture or plain film evidence of avascular necrosis.

Degenerative changes noted at the pubic symphysis and right SI
joint. No pelvic fracture or bone lesion.

Area of dystrophic calcification in the right upper thigh probably
related to prior trauma.
IMPRESSION: Age advanced right hip joint degenerative changes but no definite
fracture or AVN.

## 2018-10-27 ENCOUNTER — Telehealth: Payer: Self-pay | Admitting: Internal Medicine

## 2018-10-27 NOTE — Telephone Encounter (Signed)
The patient is a primary patient of Dr. Rennis GoldenHilty.  He calls in today anxious and upset because of DVT and symptomatic paroxysmal atrial fibrillation.  I have reviewed his documentation and have spoken to Rantoulheryl, our [employer] line nurse who received his call.  The patient is insisting that he be seen in cardiology clinic today.  With no available clinic slots, I have called the patient to discuss his symptoms and determine if medication adjustments are required.  The patient is upset and does not understand why he cannot have an urgent appointment.  He denies chest discomfort, shortness of breath, tachycardia, presyncope or syncope.  He is primarily concerned because he has friends who have had DVTs after hip surgery who have not done well.  I have reassured the patient that he is on Xarelto and this is the medication he has been prescribed for his recurrent DVTs.  It also appears that he is on metoprolol 50 mg daily.  I have encouraged the patient to present to urgent care or the emergency department if his symptoms are causing him significant distress, which is what was conveyed in his initial phone call.  He does not want to go to the emergency department or urgent care, and is frustrated that he cannot have an appointment.  I discussed with him that without an available clinic slot it would be difficult to dedicate the time to his care necessary to appropriately address his issues.  I have encouraged him to make an appointment with his primary care doctor, but he says "they will tell me the same thing, to go to the emergency department."  Before the conversation was completed, the patient disconnected the call.

## 2018-10-27 NOTE — Telephone Encounter (Signed)
New message       Patient c/o Palpitations:  High priority if patient c/o lightheadedness, shortness of breath, or chest pain  1) How long have you had palpitations/irregular HR/ Afib? Are you having the symptoms now? Since yesterday   2) Are you currently experiencing lightheadedness, SOB or CP? No   3) Do you have a history of afib (atrial fibrillation) or irregular heart rhythm? Yes   4) Have you checked your BP or HR? (document readings if available): no   5) Are you experiencing any other symptoms?chills, hot and cold ;

## 2018-11-10 ENCOUNTER — Other Ambulatory Visit: Payer: Self-pay | Admitting: Internal Medicine

## 2018-11-10 NOTE — Telephone Encounter (Signed)
Patient calling the office for samples of medication:   1.  What medication and dosage are you requesting samples for? Xarelto 20 mg  2.  Are you currently out of this medication? No just a few left

## 2018-11-11 NOTE — Telephone Encounter (Signed)
Follow up    Rerouted to nl triage

## 2018-11-11 NOTE — Telephone Encounter (Signed)
Spoke with pt. Pt sts that he only has a couple of days supply left of his Xarelto. He contacted his pharmacist and was told a 30 day supply would cost $300+. He is switching insurance and has not received received his new insurance card. Adv pt that he should still qualify the $10 copay. Recommended that the pt print a co-pay card online, activate and bring it to his pharmacy with him. Samples of Xarelto 20mg  2 bottles left at the front desk for pick up. Pt aware and voiced appreciation for assistance.

## 2018-11-13 ENCOUNTER — Ambulatory Visit: Payer: BLUE CROSS/BLUE SHIELD | Admitting: Physician Assistant

## 2019-03-10 ENCOUNTER — Telehealth: Payer: Self-pay | Admitting: *Deleted

## 2019-03-10 NOTE — Telephone Encounter (Signed)
Kyle Ryan, will call back in a few days to schedule his appointment.

## 2019-05-04 ENCOUNTER — Telehealth: Payer: Self-pay | Admitting: Internal Medicine

## 2019-05-04 NOTE — Telephone Encounter (Signed)
Spoke with pt who report his insurance plan has changed and doesn't think it covers OV with Dr. Debara Pickett. Pt state he's seen Dr. Debara Pickett for a long time and doesn't want to change providers. Pt inquiring about how much it would cost out of pocket for an OV.   Pt advised to contact the billing department. Pt voiced understanding.

## 2019-05-04 NOTE — Telephone Encounter (Signed)
New Message   Patient is calling because he was to have a cardiac CT done in 2019 but never happened. He is wanting to see about rescheduling and discussing some other things.

## 2019-05-12 ENCOUNTER — Telehealth: Payer: Self-pay | Admitting: Internal Medicine

## 2019-05-12 NOTE — Telephone Encounter (Signed)
  Patient is calling because he needs a continuity of care form filled out for his insurance so he can continue to see Dr Debara Pickett. He would like to drop the forms off to be filled out. Please advise

## 2019-05-12 NOTE — Telephone Encounter (Signed)
Pt was told to drop the paperwork off at the front desk for Dr Debara Pickett, pt verbalized understanding.

## 2019-05-18 NOTE — Telephone Encounter (Signed)
Patient called and notified form not yet received. He states he has not dropped it off yet. Advised he bring form on or by 05/25/2019 since MD will be in office this day. He voiced understanding

## 2019-06-04 ENCOUNTER — Encounter: Payer: Self-pay | Admitting: Internal Medicine

## 2019-06-04 NOTE — Telephone Encounter (Signed)
Received Continuity of Care form from [facility]. Patient states Dr. Debara Pickett is out of network on his insurance and he wants to keep his care with him. He is requesting this form be completed. MD and RN reviewed form and there is nothing for MD to sign concerning the matter. Called patient to explain this and inform him that he should be criteria has the has a "chronic illness or condition" and is "receiving outpatient care on a long-term basis" for PAF, HTN, dyslipidemia, PVCs. Explained that I can complete this section on his behalf.   He asked that I call [employer] to ensure this is sufficient - phone # [phone] Explained that I will not be able to complete this today but should be able to within the next week and I will follow up with him

## 2019-06-04 NOTE — Telephone Encounter (Signed)
Opened in error

## 2019-06-09 NOTE — Telephone Encounter (Signed)
Spoke with [employer] and verified that there is only 1 section MD or authorizing person needs to complete (as denoted below).   Informed patient of this. Will complete provider section and patient will pick up form to be completed/submitted

## 2019-06-17 ENCOUNTER — Other Ambulatory Visit: Payer: Self-pay

## 2019-06-17 DIAGNOSIS — Z20822 Contact with and (suspected) exposure to covid-19: Secondary | ICD-10-CM

## 2019-06-18 LAB — NOVEL CORONAVIRUS, NAA: SARS-CoV-2, NAA: NOT DETECTED

## 2019-06-22 ENCOUNTER — Telehealth: Payer: Self-pay | Admitting: General Practice

## 2019-06-22 NOTE — Telephone Encounter (Signed)
Negative COVID results given. Patient results "NOT Detected." Caller expressed understanding. ° °

## 2019-06-22 NOTE — Telephone Encounter (Signed)
Per fax from [facility] dated 06/18/2019, the appeal for Dr. Debara Pickett being out of network for patient was denied with dates of service 06/16/2019 - 09/14/2019.   Phone: [phone] Fax: [phone]  [employer] [place] [employer] and [place] [address]

## 2019-06-26 ENCOUNTER — Other Ambulatory Visit: Payer: Self-pay

## 2019-06-26 DIAGNOSIS — Z20822 Contact with and (suspected) exposure to covid-19: Secondary | ICD-10-CM

## 2019-06-27 LAB — NOVEL CORONAVIRUS, NAA: SARS-CoV-2, NAA: NOT DETECTED

## 2019-07-10 ENCOUNTER — Other Ambulatory Visit: Payer: Self-pay

## 2019-07-10 DIAGNOSIS — Z20822 Contact with and (suspected) exposure to covid-19: Secondary | ICD-10-CM

## 2019-07-11 LAB — NOVEL CORONAVIRUS, NAA: SARS-CoV-2, NAA: NOT DETECTED

## 2019-07-14 ENCOUNTER — Telehealth: Payer: Self-pay | Admitting: General Practice

## 2019-07-14 NOTE — Telephone Encounter (Signed)
Negative COVID results given. Patient results "NOT Detected." Caller expressed understanding. ° °

## 2019-08-14 ENCOUNTER — Other Ambulatory Visit: Payer: Self-pay

## 2019-08-14 DIAGNOSIS — Z20822 Contact with and (suspected) exposure to covid-19: Secondary | ICD-10-CM

## 2019-08-16 LAB — NOVEL CORONAVIRUS, NAA: SARS-CoV-2, NAA: NOT DETECTED

## 2019-08-24 ENCOUNTER — Telehealth: Payer: Self-pay | Admitting: General Practice

## 2019-08-24 NOTE — Telephone Encounter (Signed)
Negative COVID results given. Patient results "NOT Detected." Caller expressed understanding. ° °

## 2019-08-27 ENCOUNTER — Other Ambulatory Visit: Payer: Self-pay

## 2019-08-27 DIAGNOSIS — Z20822 Contact with and (suspected) exposure to covid-19: Secondary | ICD-10-CM

## 2019-08-29 LAB — NOVEL CORONAVIRUS, NAA: SARS-CoV-2, NAA: NOT DETECTED

## 2019-09-14 ENCOUNTER — Other Ambulatory Visit: Payer: Self-pay

## 2019-09-14 DIAGNOSIS — Z20822 Contact with and (suspected) exposure to covid-19: Secondary | ICD-10-CM

## 2019-09-15 LAB — NOVEL CORONAVIRUS, NAA: SARS-CoV-2, NAA: NOT DETECTED

## 2019-10-14 ENCOUNTER — Other Ambulatory Visit: Payer: Self-pay

## 2019-10-14 DIAGNOSIS — Z20822 Contact with and (suspected) exposure to covid-19: Secondary | ICD-10-CM

## 2019-10-15 LAB — NOVEL CORONAVIRUS, NAA: SARS-CoV-2, NAA: NOT DETECTED

## 2019-10-26 ENCOUNTER — Ambulatory Visit: Payer: BLUE CROSS/BLUE SHIELD | Attending: Internal Medicine

## 2019-10-26 DIAGNOSIS — Z20822 Contact with and (suspected) exposure to covid-19: Secondary | ICD-10-CM

## 2019-10-27 ENCOUNTER — Other Ambulatory Visit: Payer: BLUE CROSS/BLUE SHIELD

## 2019-10-27 LAB — NOVEL CORONAVIRUS, NAA: SARS-CoV-2, NAA: NOT DETECTED

## 2019-11-10 ENCOUNTER — Telehealth: Payer: Self-pay | Admitting: Internal Medicine

## 2019-11-10 NOTE — Telephone Encounter (Signed)
I spoke with pt. He would like to continue to see Dr Rennis Golden but he is out of patient's network.  Patient is asking cost of visit without insurance.  Will send message to billing department.  Pt is asking if OK to get covid vaccine since he is on a blood thinner. I told him this should not stop him from getting vaccine and he may notice bruising at injection site.

## 2019-11-10 NOTE — Telephone Encounter (Signed)
Patient asking for a call back from Dr. Blanchie Dessert nurse. He states he has some questions for her before his appt with him on 12/09/19.

## 2019-11-11 ENCOUNTER — Other Ambulatory Visit: Payer: BLUE CROSS/BLUE SHIELD

## 2019-11-11 ENCOUNTER — Ambulatory Visit: Payer: BLUE CROSS/BLUE SHIELD | Attending: Internal Medicine

## 2019-11-11 DIAGNOSIS — Z20822 Contact with and (suspected) exposure to covid-19: Secondary | ICD-10-CM

## 2019-11-12 LAB — NOVEL CORONAVIRUS, NAA: SARS-CoV-2, NAA: NOT DETECTED

## 2019-11-18 NOTE — Telephone Encounter (Signed)
Darnell Level, RN; [facility] Billing  Good Morning Eileen Stanford,   A voicemail has been left for Mr. Vane to contact the [facility] billing department to discuss the cost of his visits.   Thank you,  Bertram Gala

## 2019-11-27 ENCOUNTER — Ambulatory Visit: Payer: BLUE CROSS/BLUE SHIELD | Attending: Internal Medicine

## 2019-11-27 DIAGNOSIS — Z20822 Contact with and (suspected) exposure to covid-19: Secondary | ICD-10-CM

## 2019-11-28 LAB — NOVEL CORONAVIRUS, NAA: SARS-CoV-2, NAA: NOT DETECTED

## 2019-12-01 ENCOUNTER — Telehealth: Payer: Self-pay | Admitting: Internal Medicine

## 2019-12-01 NOTE — Telephone Encounter (Signed)
Thank you :)

## 2019-12-01 NOTE — Telephone Encounter (Signed)
   [facility] Pre-operative Risk Assessment    Request for surgical clearance:  1. What type of surgery is being performed? Colonoscopy and Upper Endoscopy  2. When is this surgery scheduled? TBD  3. What type of clearance is required (medical clearance vs. Pharmacy clearance to hold med vs. Both)? Both  4. Are there any medications that need to be held prior to surgery and how long? Xarelto, needs to be held 2 days prior  5. Practice name and name of physician performing surgery?  [facility] - Dr. Rolm Bookbinder  6. What is your office phone number? [phone]  7.   What is your office fax number [phone]  8.   Anesthesia type (None, local, MAC, general) ? Propofol   Zara Council 12/01/2019, 9:26 AM  _________________________________________________________________   (provider comments below)

## 2019-12-01 NOTE — Telephone Encounter (Signed)
I s/w pt who tells me that his procedure is set for Monday 12/07/19 and his appt with Dr. Rennis Golden is set for 12/09/19. I advised the pt that we will need to move his appt up. I have moved appt to tomorrow 12/02/19 with Joni Reining, DNP. Pt thanked me for the call and my help. Pt states to me that our practice is now out of network but he was still going to come in. He asked if he can be billed for tomorrows visit. I assured the pt that our office will help with that when he comes in tomorrow for his appt. I will cancel the 12/09/19 appt with Dr. Rennis Golden. I will forward notes to Joni Reining, DNP for appt tomorrow. I will remove from the pre op call back pool.

## 2019-12-01 NOTE — Telephone Encounter (Signed)
I called and s/w Dr. Christella Scheuermann office and gave update that the pt has appt 12/09/19 with Dr. Rennis Golden for pre op clearance.   I also lmom for the pt per pre op team to call the pt and confirm appt for pre op clearance. I will forward to Dr. Rennis Golden for pre op assessment. I will remove from the pre op call back pool.

## 2019-12-01 NOTE — Telephone Encounter (Signed)
   Primary Cardiologist:Kenneth C Hilty, MD  Chart reviewed as part of pre-operative protocol coverage. Because of Kyle Ryan's past medical history and time since last visit, he/she will require a follow-up visit in order to better assess preoperative cardiovascular risk.  He was last seen in our office in 09/2018.   Pre-op covering staff: - The pt is scheduled to see Dr. Rennis Golden on 2/3. Please reinforce to pt that he should keep this appt so as not to delay his procedure.  - Please contact requesting surgeon's office via preferred method (i.e, phone, fax) to inform them of need for appointment prior to surgery.  If applicable, this message will also be routed to pharmacy pool and/or primary cardiologist for input on holding anticoagulant/antiplatelet agent as requested below so that this information is available at time of patient's appointment.   Berton Bon, NP  12/01/2019, 10:32 AM

## 2019-12-01 NOTE — Progress Notes (Signed)
Cardiology Office Note   Date:  12/02/2019   ID:  ANTWUAN ECKLEY, DOB Jan 09, 1961, MRN [id number]  PCP:  Darrow Bussing, MD  Cardiologist: Dr. Rennis Golden  -CC: Pre-Op Evaluaiton   History of Present Illness: OSCAR HANK is a 59 y.o. 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  He was noted to be at risk for post thrombotic syndrome and therefore was recommended to wear lower extremity compression stockings to help with this along with lower extremity edema.  He has chronic dyspnea on exertion, with other history to include hypertension, hyperlipidemia.  He was last seen by Dr. Rennis Golden on 09/24/2018 for preoperative risk evaluation for hip surgery.  At that time he was stable from a cardiac standpoint and was to proceed with planned surgery.  He called our office yesterday stating that a GI procedure  is now planned for  December 07, 2019 and requests an earlier appointment than previously scheduled for preoperative evaluation.  He was expected to hold Xarelto for 48 hours prior to procedure.  He is being seen by [facility] in [facility]. He has been lost ot follow up due to insurance issues concerning continuing with our practice.  He has not yet changed to a different cardiology practice.  His primary care physician is providing his refills on medications, follow-up labs, and physical exams.  He states he is seeing GI as he has been noticing some blood in his stools after having periods of constipation.  He denies any hemoptysis, or significant melena.  He has also noticed some burning in his abdomen with some gas.  He denies chest pain, dyspnea on exertion, excessive lower extremity edema or leg pain.  He continues to wear support hose.  Past Medical History:  Diagnosis Date  . DVT (deep venous thrombosis) (HCC) 10/04/2011   on Xarelto  . ED (erectile dysfunction)   . Hypertension   . Limb pain    LEA VENOUS  DUPLEX, 12/31/2011 - positive DVT in femoral and popliteal veins  . PAF (paroxysmal atrial fibrillation) (HCC)     Past Surgical History:  Procedure Laterality Date  . arthroscopy of knee       Current Outpatient Medications  Medication Sig Dispense Refill  . amLODipine-valsartan (EXFORGE) 10-160 MG tablet Take 1 tablet by mouth daily.  0  . atorvastatin (LIPITOR) 20 MG tablet   11  . metoprolol succinate (TOPROL-XL) 50 MG 24 hr tablet Take 50 mg by mouth daily. Take with or immediately following a meal.    . rivaroxaban (XARELTO) 20 MG TABS tablet Take 1 tablet (20 mg total) by mouth daily with supper. 30 tablet 6  . Tamsulosin HCl (FLOMAX) 0.4 MG CAPS Take 0.4 mg by mouth daily after supper.       No current facility-administered medications for this visit.    Allergies:   Flagyl [metronidazole hcl]    Social History:  The patient  reports that he quit smoking about 8 years ago. He has never used smokeless tobacco. He reports current alcohol use. He reports current drug use. Drug: Marijuana.   Family History:  The patient's family history includes Arrhythmia in his mother; Diabetes in his mother and sister; Hypertension in his brother, mother, and sister; Stroke in his mother.    ROS: All other systems are reviewed and negative. Unless otherwise mentioned in H&P    PHYSICAL EXAM: VS:  BP 130/72   Pulse Marland Kitchen)  58   Ht 6\' 5"  (1.956 m)   Wt 218 lb 9.6 oz (99.2 kg)   BMI 25.92 kg/m  , BMI Body mass index is 25.92 kg/m. GEN: Well nourished, well developed, in no acute distress HEENT: normal Neck: no JVD, carotid bruits, or masses Cardiac: RRR; no murmurs, rubs, or gallops,no edema  Respiratory:  Clear to auscultation bilaterally, normal work of breathing GI: soft, nontender, nondistended, + BS MS: no deformity or atrophy Skin: warm and dry, no rash Neuro:  Strength and sensation are intact Psych: euthymic mood, full affect   EKG: Sinus bradycardia, heart rate of 58 bpm.   Recent Labs: No results found for requested labs within last 8760 hours.    Lipid Panel    Component Value Date/Time   CHOL 181 03/08/2012 0858   TRIG 73 03/08/2012 0858   HDL 60 03/08/2012 0858   CHOLHDL 3.0 03/08/2012 0858   VLDL 15 03/08/2012 0858   LDLCALC 106 (H) 03/08/2012 0858      Wt Readings from Last 3 Encounters:  12/02/19 218 lb 9.6 oz (99.2 kg)  09/24/18 224 lb (101.6 kg)  03/19/18 221 lb 9.6 oz (100.5 kg)      Other studies Reviewed:  NM Stress Test 04/07/2014 Impression Exercise Capacity:  Lexiscan with no exercise. BP Response:  Normal blood pressure response. Clinical Symptoms:  No symptoms. ECG Impression:  No significant ECG changes with Lexiscan. Comparison with Prior Nuclear Study: No images to compare  Overall Impression:  Low risk stress nuclear study with a Small sized mild intensity fixed perfusion defect in the basal to mid inferoseptal wall - suggestive of a possible infarction..  ASSESSMENT AND PLAN:  1.  Preprocedure evaluation: Plan for EGD and colonoscopy through [facility] in [place], on December 07, 2019.  He is stable from a cardiac standpoint to proceed.  The patient will hold Xarelto for 48 hours prior to procedure and begin as soon as possible thereafter.  No further cardiac testing is warranted at this time.  2.  Hypertension: Currently well controlled.  Continue metoprolol and amlodipine/valsartan 10/160 daily.  As directed.  3.  Hypercholesterolemia:: Labs are followed by primary care.  I do not have the latest levels by searching Care Everywhere.  This is being managed by primary care who are providing statin therapy with atorvastatin.  4.  Chronic DVT: Remains on Xarelto 20 mg daily.  Has had some bleeding after constipation during bowel movements.  Plan EGD and colonoscopy as above with [facility] in [facility].  I reviewed labs she does not have evidence of anemia at this time.   Current  medicines are reviewed at length with the patient today.    Labs/ tests ordered today include: None  Phill Myron. West Pugh, ANP, [facility]   12/02/2019 8:27 AM    [facility] [place] [address] Office [phone] Fax [phone]  Notice: This dictation was prepared with Dragon dictation along with smaller phrase technology. Any transcriptional errors that result from this process are unintentional and may not be corrected upon review.

## 2019-12-01 NOTE — Telephone Encounter (Signed)
Patient with diagnosis of afib/chronic DVT on Xarelto for anticoagulation.    Procedure: Colonoscopy and Upper Endoscopy Date of procedure: TBD  CHADS2-VASc score of  2 (HTN,CAD)  CrCl 62 ml/min  Patient has a history of chronic DVT (2012) and is on lifelong anticoagulation. Patient was previously (09/24/18) cleared by Dr. Rennis Golden to hold Xarelto for 3 days for knee surgery.  Patient may hold Xarelto for 2 days prior to procedure.

## 2019-12-01 NOTE — Telephone Encounter (Signed)
Patient calling back for [facility]. He states that his procedure is for Monday and his appt for surgical clearance is Wednesday. He wants to speak with Okey Regal before rescheduling to get her opinion on what he should do.

## 2019-12-02 ENCOUNTER — Ambulatory Visit (INDEPENDENT_AMBULATORY_CARE_PROVIDER_SITE_OTHER): Payer: BLUE CROSS/BLUE SHIELD | Admitting: Adult Health

## 2019-12-02 ENCOUNTER — Encounter: Payer: Self-pay | Admitting: Adult Health

## 2019-12-02 ENCOUNTER — Telehealth: Payer: Self-pay

## 2019-12-02 ENCOUNTER — Other Ambulatory Visit: Payer: Self-pay

## 2019-12-02 VITALS — BP 130/72 | HR 58 | Ht 77.0 in | Wt 218.6 lb

## 2019-12-02 DIAGNOSIS — E7800 Pure hypercholesterolemia, unspecified: Secondary | ICD-10-CM

## 2019-12-02 DIAGNOSIS — I10 Essential (primary) hypertension: Secondary | ICD-10-CM | POA: Diagnosis not present

## 2019-12-02 DIAGNOSIS — Z86718 Personal history of other venous thrombosis and embolism: Secondary | ICD-10-CM | POA: Diagnosis not present

## 2019-12-02 DIAGNOSIS — Z01810 Encounter for preprocedural cardiovascular examination: Secondary | ICD-10-CM

## 2019-12-02 NOTE — Telephone Encounter (Signed)
Joni Reining [facility] 12/02/19 office note faxed to [facility] at fax # [phone].

## 2019-12-02 NOTE — Patient Instructions (Signed)
Medication Instructions:  Hold Xarelto 48 hours before procedure and restart the day after.  Lab Work: None ordered   Testing/Procedures: None ordered  Follow-Up: At [facility], you and your health needs are our priority.  As part of our continuing mission to provide you with exceptional heart care, we have created designated Provider Care Teams.  These Care Teams include your primary Cardiologist (physician) and Advanced Practice Providers (APPs -  Physician Assistants and Nurse Practitioners) who all work together to provide you with the care you need, when you need it.  Your next appointment:  1 year   Call in Oct to schedule Jan appointment    The format for your next appointment:  Office   Provider:  Dr.Hilty    You are cleared to have upcoming Colonoscopy and Endoscopy

## 2019-12-09 ENCOUNTER — Ambulatory Visit: Payer: BLUE CROSS/BLUE SHIELD | Admitting: Internal Medicine

## 2019-12-11 ENCOUNTER — Ambulatory Visit: Payer: BLUE CROSS/BLUE SHIELD | Attending: Internal Medicine

## 2019-12-11 DIAGNOSIS — Z20822 Contact with and (suspected) exposure to covid-19: Secondary | ICD-10-CM

## 2019-12-12 LAB — NOVEL CORONAVIRUS, NAA: SARS-CoV-2, NAA: NOT DETECTED

## 2019-12-16 ENCOUNTER — Telehealth: Payer: Self-pay | Admitting: General Practice

## 2019-12-16 NOTE — Telephone Encounter (Signed)
Negative COVID results given. Patient results "NOT Detected." Caller expressed understanding. ° °

## 2019-12-25 ENCOUNTER — Ambulatory Visit: Payer: BLUE CROSS/BLUE SHIELD

## 2019-12-28 ENCOUNTER — Other Ambulatory Visit: Payer: BLUE CROSS/BLUE SHIELD

## 2020-01-01 ENCOUNTER — Other Ambulatory Visit: Payer: BLUE CROSS/BLUE SHIELD

## 2020-01-06 ENCOUNTER — Ambulatory Visit: Payer: BLUE CROSS/BLUE SHIELD | Attending: Internal Medicine

## 2020-01-06 DIAGNOSIS — Z20822 Contact with and (suspected) exposure to covid-19: Secondary | ICD-10-CM

## 2020-01-07 LAB — NOVEL CORONAVIRUS, NAA: SARS-CoV-2, NAA: NOT DETECTED

## 2020-01-22 ENCOUNTER — Ambulatory Visit: Payer: BLUE CROSS/BLUE SHIELD | Attending: Internal Medicine

## 2020-01-22 DIAGNOSIS — Z20822 Contact with and (suspected) exposure to covid-19: Secondary | ICD-10-CM

## 2020-01-23 LAB — NOVEL CORONAVIRUS, NAA: SARS-CoV-2, NAA: NOT DETECTED

## 2020-01-25 ENCOUNTER — Telehealth: Payer: Self-pay | Admitting: General Practice

## 2020-01-25 NOTE — Telephone Encounter (Signed)
Negative COVID results given. Patient results "NOT Detected." Caller expressed understanding. ° °

## 2020-01-29 ENCOUNTER — Other Ambulatory Visit: Payer: BLUE CROSS/BLUE SHIELD

## 2020-02-05 ENCOUNTER — Ambulatory Visit: Payer: BLUE CROSS/BLUE SHIELD | Attending: Internal Medicine

## 2020-02-05 ENCOUNTER — Ambulatory Visit: Payer: Self-pay | Attending: Internal Medicine

## 2020-02-05 DIAGNOSIS — Z20822 Contact with and (suspected) exposure to covid-19: Secondary | ICD-10-CM

## 2020-02-06 LAB — SARS-COV-2, NAA 2 DAY TAT

## 2020-02-06 LAB — NOVEL CORONAVIRUS, NAA: SARS-CoV-2, NAA: NOT DETECTED

## 2020-02-17 ENCOUNTER — Ambulatory Visit: Payer: BLUE CROSS/BLUE SHIELD | Attending: Internal Medicine

## 2020-02-17 DIAGNOSIS — Z20822 Contact with and (suspected) exposure to covid-19: Secondary | ICD-10-CM

## 2020-02-18 LAB — SARS-COV-2, NAA 2 DAY TAT

## 2020-02-18 LAB — NOVEL CORONAVIRUS, NAA: SARS-CoV-2, NAA: NOT DETECTED

## 2020-02-24 ENCOUNTER — Other Ambulatory Visit: Payer: Self-pay

## 2020-02-24 ENCOUNTER — Ambulatory Visit: Payer: BLUE CROSS/BLUE SHIELD | Attending: Internal Medicine

## 2020-02-24 DIAGNOSIS — Z20822 Contact with and (suspected) exposure to covid-19: Secondary | ICD-10-CM

## 2020-02-25 LAB — NOVEL CORONAVIRUS, NAA: SARS-CoV-2, NAA: NOT DETECTED

## 2020-02-25 LAB — SARS-COV-2, NAA 2 DAY TAT

## 2020-02-29 ENCOUNTER — Telehealth: Payer: Self-pay | Admitting: General Practice

## 2020-02-29 NOTE — Telephone Encounter (Signed)
Negative COVID results given. Patient results "NOT Detected." Caller expressed understanding. ° °

## 2020-03-02 ENCOUNTER — Ambulatory Visit: Payer: BLUE CROSS/BLUE SHIELD | Attending: Internal Medicine

## 2020-03-02 DIAGNOSIS — Z20822 Contact with and (suspected) exposure to covid-19: Secondary | ICD-10-CM

## 2020-03-03 LAB — NOVEL CORONAVIRUS, NAA: SARS-CoV-2, NAA: NOT DETECTED

## 2020-03-03 LAB — SARS-COV-2, NAA 2 DAY TAT

## 2020-03-09 ENCOUNTER — Ambulatory Visit: Payer: BLUE CROSS/BLUE SHIELD | Attending: Internal Medicine

## 2020-03-09 DIAGNOSIS — Z20822 Contact with and (suspected) exposure to covid-19: Secondary | ICD-10-CM

## 2020-03-10 LAB — SARS-COV-2, NAA 2 DAY TAT

## 2020-03-10 LAB — NOVEL CORONAVIRUS, NAA: SARS-CoV-2, NAA: NOT DETECTED

## 2020-03-16 ENCOUNTER — Ambulatory Visit: Payer: Self-pay | Attending: Internal Medicine

## 2020-03-16 ENCOUNTER — Telehealth: Payer: Self-pay | Admitting: Family Medicine

## 2020-03-16 DIAGNOSIS — Z20822 Contact with and (suspected) exposure to covid-19: Secondary | ICD-10-CM

## 2020-03-16 NOTE — Telephone Encounter (Signed)
Negative COVID results given. Patient results "NOT Detected." Caller expressed understanding. ° °

## 2020-03-17 LAB — NOVEL CORONAVIRUS, NAA: SARS-CoV-2, NAA: NOT DETECTED

## 2020-03-17 LAB — SARS-COV-2, NAA 2 DAY TAT

## 2020-03-23 ENCOUNTER — Ambulatory Visit: Payer: BLUE CROSS/BLUE SHIELD | Attending: Internal Medicine

## 2020-03-23 DIAGNOSIS — Z20822 Contact with and (suspected) exposure to covid-19: Secondary | ICD-10-CM

## 2020-03-24 LAB — SARS-COV-2, NAA 2 DAY TAT

## 2020-03-24 LAB — NOVEL CORONAVIRUS, NAA: SARS-CoV-2, NAA: NOT DETECTED

## 2020-03-30 ENCOUNTER — Ambulatory Visit: Payer: BLUE CROSS/BLUE SHIELD | Attending: Internal Medicine

## 2020-03-30 ENCOUNTER — Other Ambulatory Visit: Payer: BLUE CROSS/BLUE SHIELD

## 2020-03-30 DIAGNOSIS — Z20822 Contact with and (suspected) exposure to covid-19: Secondary | ICD-10-CM

## 2020-03-31 LAB — NOVEL CORONAVIRUS, NAA: SARS-CoV-2, NAA: NOT DETECTED

## 2020-03-31 LAB — SARS-COV-2, NAA 2 DAY TAT

## 2020-04-06 ENCOUNTER — Ambulatory Visit: Payer: BLUE CROSS/BLUE SHIELD | Attending: Internal Medicine

## 2020-04-06 DIAGNOSIS — Z20822 Contact with and (suspected) exposure to covid-19: Secondary | ICD-10-CM

## 2020-04-07 LAB — NOVEL CORONAVIRUS, NAA: SARS-CoV-2, NAA: NOT DETECTED

## 2020-04-07 LAB — SARS-COV-2, NAA 2 DAY TAT

## 2020-04-13 ENCOUNTER — Ambulatory Visit: Payer: BLUE CROSS/BLUE SHIELD | Attending: Internal Medicine

## 2020-04-13 DIAGNOSIS — Z20822 Contact with and (suspected) exposure to covid-19: Secondary | ICD-10-CM

## 2020-04-14 LAB — SARS-COV-2, NAA 2 DAY TAT

## 2020-04-14 LAB — NOVEL CORONAVIRUS, NAA: SARS-CoV-2, NAA: NOT DETECTED

## 2020-04-20 ENCOUNTER — Ambulatory Visit: Payer: BLUE CROSS/BLUE SHIELD | Attending: Internal Medicine

## 2020-04-20 DIAGNOSIS — Z20822 Contact with and (suspected) exposure to covid-19: Secondary | ICD-10-CM

## 2020-04-21 LAB — NOVEL CORONAVIRUS, NAA: SARS-CoV-2, NAA: NOT DETECTED

## 2020-04-21 LAB — SARS-COV-2, NAA 2 DAY TAT

## 2020-04-27 ENCOUNTER — Ambulatory Visit: Payer: BLUE CROSS/BLUE SHIELD | Attending: Internal Medicine

## 2020-04-27 DIAGNOSIS — Z20822 Contact with and (suspected) exposure to covid-19: Secondary | ICD-10-CM

## 2020-04-28 LAB — SARS-COV-2, NAA 2 DAY TAT

## 2020-04-28 LAB — NOVEL CORONAVIRUS, NAA: SARS-CoV-2, NAA: NOT DETECTED

## 2020-05-04 ENCOUNTER — Ambulatory Visit: Payer: BLUE CROSS/BLUE SHIELD | Attending: Internal Medicine

## 2020-05-04 DIAGNOSIS — Z20822 Contact with and (suspected) exposure to covid-19: Secondary | ICD-10-CM

## 2020-05-05 LAB — SARS-COV-2, NAA 2 DAY TAT

## 2020-05-05 LAB — NOVEL CORONAVIRUS, NAA: SARS-CoV-2, NAA: NOT DETECTED

## 2020-05-06 ENCOUNTER — Telehealth: Payer: Self-pay | Admitting: General Practice

## 2020-05-06 NOTE — Telephone Encounter (Signed)
Patient advised of negative covid-19 result. Patient verbalized understanding.

## 2020-05-11 ENCOUNTER — Ambulatory Visit: Payer: BLUE CROSS/BLUE SHIELD | Attending: Internal Medicine

## 2020-06-01 ENCOUNTER — Ambulatory Visit: Payer: BLUE CROSS/BLUE SHIELD | Attending: Internal Medicine

## 2020-06-01 DIAGNOSIS — Z20822 Contact with and (suspected) exposure to covid-19: Secondary | ICD-10-CM

## 2020-06-02 LAB — SARS-COV-2, NAA 2 DAY TAT

## 2020-06-02 LAB — NOVEL CORONAVIRUS, NAA: SARS-CoV-2, NAA: NOT DETECTED

## 2021-09-29 ENCOUNTER — Other Ambulatory Visit: Payer: Self-pay | Admitting: Family Medicine

## 2023-11-05 ENCOUNTER — Emergency Department (HOSPITAL_BASED_OUTPATIENT_CLINIC_OR_DEPARTMENT_OTHER): Payer: BLUE CROSS/BLUE SHIELD

## 2023-11-05 ENCOUNTER — Other Ambulatory Visit: Payer: Self-pay

## 2023-11-05 ENCOUNTER — Emergency Department (HOSPITAL_BASED_OUTPATIENT_CLINIC_OR_DEPARTMENT_OTHER)
Admission: EM | Admit: 2023-11-05 | Discharge: 2023-11-05 | Disposition: A | Discharge status: Home / Self Care | Payer: BLUE CROSS/BLUE SHIELD | Attending: Emergency Medicine | Admitting: Emergency Medicine

## 2023-11-05 DIAGNOSIS — S8012XA Contusion of left lower leg, initial encounter: Secondary | ICD-10-CM | POA: Diagnosis not present

## 2023-11-05 DIAGNOSIS — S8992XA Unspecified injury of left lower leg, initial encounter: Secondary | ICD-10-CM | POA: Diagnosis present

## 2023-11-05 DIAGNOSIS — Z7901 Long term (current) use of anticoagulants: Secondary | ICD-10-CM | POA: Diagnosis not present

## 2023-11-05 DIAGNOSIS — I82501 Chronic embolism and thrombosis of unspecified deep veins of right lower extremity: Secondary | ICD-10-CM | POA: Diagnosis not present

## 2023-11-05 DIAGNOSIS — Y9301 Activity, walking, marching and hiking: Secondary | ICD-10-CM | POA: Insufficient documentation

## 2023-11-05 DIAGNOSIS — I10 Essential (primary) hypertension: Secondary | ICD-10-CM | POA: Insufficient documentation

## 2023-11-05 DIAGNOSIS — Z87891 Personal history of nicotine dependence: Secondary | ICD-10-CM | POA: Diagnosis not present

## 2023-11-05 DIAGNOSIS — Z79899 Other long term (current) drug therapy: Secondary | ICD-10-CM | POA: Insufficient documentation

## 2023-11-05 DIAGNOSIS — X509XXA Other and unspecified overexertion or strenuous movements or postures, initial encounter: Secondary | ICD-10-CM | POA: Insufficient documentation

## 2023-11-05 DIAGNOSIS — I480 Paroxysmal atrial fibrillation: Secondary | ICD-10-CM | POA: Diagnosis not present

## 2023-11-05 DIAGNOSIS — I824Y1 Acute embolism and thrombosis of unspecified deep veins of right proximal lower extremity: Secondary | ICD-10-CM

## 2023-11-05 NOTE — ED Triage Notes (Signed)
 Patient here   Felt sudden pain to Right Posterior Leg that began 1.5 weeks ago while he was doing laundry. Pain since has been intermittent but worsening. Has noted bruising/redness to entire leg more recently.  Currently takes Xarelto  for previous clots. No known Fevers.  NAD Noted during Triage. A&Ox4. GCS 15. Ambulatory.

## 2023-11-05 NOTE — Discharge Instructions (Addendum)
 As discussed, I suspect your symptoms are likely secondary to hematoma or pocket of blood beneath the skin from your traumatic right hamstring injury.  Recommend continued compression of the Ace bandages and applying heat over the area to help body absorb this.  Recommend follow-up with your vascular specialist in the outpatient setting for reassessment of your DVT.  Please do not hesitate to return if the worrisome signs and symptoms we discussed become apparent.

## 2023-11-05 NOTE — ED Notes (Signed)
Ace wrap applied to right thigh as per order.

## 2023-11-05 NOTE — ED Notes (Signed)
 Discharge paperwork reviewed entirely with patient, including follow up care. Pain was under control. No prescriptions were called in, but all questions were addressed.  Pt verbalized understanding as well as all parties involved. No questions or concerns voiced at the time of discharge. No acute distress noted.   Pt ambulated out to PVA without incident or assistance.  Pt advised they will notify their PCP immediately. and Pt advised they will seek followup care with a specialist and followup with their PCP.

## 2023-11-05 NOTE — ED Provider Notes (Signed)
 [place] [facility] Provider Note   CSN: [id number] Arrival date & time: 11/05/23  0940     History  Chief Complaint  Patient presents with   Leg Pain    Kyle Ryan is a 62 y.o. male.   Leg Pain   62 year old male presents emergency department with complaints of bruising right hamstring.  States that he had incident was bending over to pick up some laundry about a week and a half ago when he felt a pull in his right hamstring.  Reports pain ever since then that has been improving.  States he never looked at his hamstring area to assess for any abnormalities visually.  Says that he was walking by mirror this morning and noted bruising/bleeding when he says skin prompting visit to the emergency department.  Patient is with history of DVT and is concerned about recurrence.  Denies any fever, chills, weakness/sensory deficits in the leg.  Denies any recurrence of trauma.  Has been wearing compression stocking on right lower extremity where area of discoloration seems to abruptly transition with no ecchymosis distally.  Past medical history significant for DVT, hypertension, paroxysmal atrial fibrillation on Xarelto , hyperlipidemia, PVC, ED  Home Medications Prior to Admission medications   Medication Sig Start Date End Date Taking? Authorizing Provider  amLODipine-valsartan (EXFORGE) 10-160 MG tablet Take 1 tablet by mouth daily. 12/12/16   [provider]  atorvastatin (LIPITOR) 20 MG tablet  03/02/18   [provider]  metoprolol  succinate (TOPROL -XL) 50 MG 24 hr tablet Take 50 mg by mouth daily. Take with or immediately following a meal.    [provider]  rivaroxaban  (XARELTO ) 20 MG TABS tablet Take 1 tablet (20 mg total) by mouth daily with supper. 09/24/18   Hilty, Vinie BROCKS, MD  Tamsulosin  HCl (FLOMAX ) 0.4 MG CAPS Take 0.4 mg by mouth daily after supper.      [provider]      Allergies    Flagyl  [metronidazole hcl]    Review of Systems   Review of Systems  All other systems reviewed and are negative.   Physical Exam Updated Vital Signs BP 111/75 (BP Location: Right Arm)   Pulse (!) 59   Temp 98.2 F (36.8 C) (Oral)   Resp 18   Ht 6' 5 (1.956 m)   Wt 97.5 kg   SpO2 99%   BMI 25.50 kg/m  Physical Exam Vitals and nursing note reviewed.  Constitutional:      General: He is not in acute distress.    Appearance: He is well-developed.  HENT:     Head: Normocephalic and atraumatic.  Eyes:     Conjunctiva/sclera: Conjunctivae normal.  Cardiovascular:     Rate and Rhythm: Normal rate and regular rhythm.     Heart sounds: No murmur heard. Pulmonary:     Effort: Pulmonary effort is normal. No respiratory distress.     Breath sounds: Normal breath sounds.  Abdominal:     Palpations: Abdomen is soft.     Tenderness: There is no abdominal tenderness.  Musculoskeletal:        General: No swelling.     Cervical back: Neck supple.     Comments: Full range of motion bilateral hips, knees, ankles, digits.  Hematoma appreciated right hamstring area.  Localized swelling.  Compartments soft and supple.  No sensory deficits on major nerve distributions of lower extremities.  Pedal and posterior tibial pulses 2+ bilaterally.  No obvious  erythema, palpable warmth.  Skin:    General: Skin is warm and dry.     Capillary Refill: Capillary refill takes less than 2 seconds.  Neurological:     Mental Status: He is alert.  Psychiatric:        Mood and Affect: Mood normal.     ED Results / Procedures / Treatments   Labs (all labs ordered are listed, but only abnormal results are displayed) Labs Reviewed - No data to display  EKG None  Radiology US  Venous Img Lower Right (DVT Study) Result Date: 11/05/2023 CLINICAL DATA:  Right lower extremity pain. History of previous DVT. Evaluate for acute or chronic DVT. EXAM: RIGHT LOWER EXTREMITY VENOUS DOPPLER ULTRASOUND TECHNIQUE:  Gray-scale sonography with graded compression, as well as color Doppler and duplex ultrasound were performed to evaluate the lower extremity deep venous systems from the level of the common femoral vein and including the common femoral, femoral, profunda femoral, popliteal and calf veins including the posterior tibial, peroneal and gastrocnemius veins when visible. The superficial great saphenous vein was also interrogated. Spectral Doppler was utilized to evaluate flow at rest and with distal augmentation maneuvers in the common femoral, femoral and popliteal veins. COMPARISON:  Right lower extremity venous Doppler ultrasound-01/04/2020 FINDINGS: Contralateral Common Femoral Vein: Respiratory phasicity is normal and symmetric with the symptomatic side. No evidence of thrombus. Normal compressibility. Common Femoral Vein: No evidence of thrombus. Normal compressibility, respiratory phasicity and response to augmentation. Saphenofemoral Junction: No evidence of thrombus. Normal compressibility and flow on color Doppler imaging. Profunda Femoral Vein: No evidence of thrombus. Normal compressibility and flow on color Doppler imaging. Femoral Vein: While the proximal aspect of the right femoral vein appears patent (image 12), there is mixed echogenic nonocclusive chronic DVT/wall thickening involving the mid (image 16) and distal (image 20) aspects of the right femoral vein, unchanged to slightly improved compared to the 2021 examination Popliteal Vein: Redemonstrated chronic occlusive thrombus within the right popliteal vein (image 25, unchanged compared to the 2021 examination. Calf Veins: There is hypoechoic occlusive thrombus involving the right posterior tibial vein (images 33 and 34), likely progressed compared to remote examination performed in 2021. The right peroneal veins appear patent where imaged. Superficial Great Saphenous Vein: No evidence of thrombus. Normal compressibility. Other Findings:  None.  IMPRESSION: 1. Occlusive DVT involving the right posterior tibial vein, potentially progressed compared to the 2021 examination. In the absence of more recent prior examinations, an acute on chronic process is not excluded. 2. Redemonstrated chronic mixed occlusive and nonocclusive DVT/wall thickening involving the mid and distal aspects of the right femoral vein as well as the right popliteal vein, overall similar to the 2021 examination. Electronically Signed   By: Norleen Roulette M.D.   On: 11/05/2023 12:33    Procedures Procedures    Medications Ordered in ED Medications - No data to display  ED Course/ Medical Decision Making/ A&P Clinical Course as of 11/05/23 1514  Tue Nov 05, 2023  1308 Consulted radiologist Dr. Roulette regarding reading of DVT ultrasound study.  Upon discussion, Posterior tibial DVT has more chronic appearance. Will discharge and have patient follow up closesly with vascular specialist [CR]    Clinical Course User Index [CR] Silver Wonda LABOR, PA                                 Medical Decision Making  This patient presents to the ED for  concern of leg discoloration, this involves an extensive number of treatment options, and is a complaint that carries with it a high risk of complications and morbidity.  The differential diagnosis includes fracture, strain/pain, dislocation, ligamentous/tendinous injury, neurovascular compromise, cellulitis, erysipelas, DIC, DVT, PAD, hematoma other   Co morbidities that complicate the patient evaluation  See HPI   Additional history obtained:  Additional history obtained from EMR External records from outside source obtained and reviewed including hospital records   Lab Tests:  N/a   Imaging Studies ordered:  I ordered imaging studies including ultrasound I independently visualized and interpreted imaging which showed occlusive DVT involving right posterior tibial vein.  Study progressed compared to 2021.  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  Similar to 2021 examination. I agree with the radiologist interpretation  Cardiac Monitoring: / EKG:  The patient was maintained on a cardiac monitor.  I personally viewed and interpreted the cardiac monitored which showed an underlying rhythm of: Sinus rhythm   Consultations Obtained:  N/a   Problem List / ED Course / Critical interventions / Medication management  Hematoma Reevaluation of the patient showed that the patient stayed the same I have reviewed the patients home medicines and have made adjustments as needed   Social Determinants of Health:  Former cigarette use.  Denies illicit drug use.   Test / Admission - Considered:  Hematoma  Vitals signs within normal range and stable throughout visit. Imaging studies significant for: See above 62 year old male presents emergency department with complaints of bruising to his right posterior thigh.  Initially had incident about a week and a half ago when he pulled his hamstring causing pain.  Has not lifted area since then.  Exam, patient with hematoma appreciated on the posterior aspect of right thigh.  No palpable warmth/induration/excessive tenderness; low suspicion for infectious process.  No bony tenderness to suggest fracture/dislocation.  No pulse deficit to suggest ischemic limb.  Suspect patient's symptoms likely secondary to traumatic hematoma from muscular injury.  Compartment soft and supple; low suspicion for compartment syndrome.  Given patient's history of DVT and expressed concern, ultrasound was obtained of which showed redemonstration of DVT with clarity possible acute on chronic DVT.  Called radiologist regarding finding of radiates imaging study more chronic DVT than anything acute.  Regarding chronic DVT, will recommend follow-up with vascular surgery in the outpatient setting for reassessment as well as continued elevation of leg and compression  therapy.  Regarding hematoma, will recommend elevation, heat, compression.  Treatment plan discussed length with patient and he acknowledged understanding was agreeable to said plan.  Patient overall well-appearing, afebrile in no acute distress. Worrisome signs and symptoms were discussed with the patient, and the patient acknowledged understanding to return to the ED if noticed. Patient was stable upon discharge.          Final Clinical Impression(s) / ED Diagnoses Final diagnoses:  Hematoma of leg, left, initial encounter  Deep vein thrombosis (DVT) of proximal vein of right lower extremity, unspecified chronicity [facility])    Rx / DC Orders ED Discharge Orders     None         Silver Wonda LABOR, [place] 11/05/23 1514    Lenor Hollering, MD 11/06/23 [phone]

## 2023-11-06 ENCOUNTER — Emergency Department (HOSPITAL_COMMUNITY)
Admission: EM | Admit: 2023-11-06 | Discharge: 2023-11-07 | Disposition: A | Discharge status: Home / Self Care | Payer: BLUE CROSS/BLUE SHIELD | Attending: Emergency Medicine | Admitting: Emergency Medicine

## 2023-11-06 ENCOUNTER — Encounter (HOSPITAL_COMMUNITY): Payer: Self-pay

## 2023-11-06 ENCOUNTER — Other Ambulatory Visit: Payer: Self-pay

## 2023-11-06 DIAGNOSIS — X501XXA Overexertion from prolonged static or awkward postures, initial encounter: Secondary | ICD-10-CM | POA: Diagnosis not present

## 2023-11-06 DIAGNOSIS — S8991XA Unspecified injury of right lower leg, initial encounter: Secondary | ICD-10-CM | POA: Diagnosis present

## 2023-11-06 DIAGNOSIS — Z7901 Long term (current) use of anticoagulants: Secondary | ICD-10-CM | POA: Diagnosis not present

## 2023-11-06 DIAGNOSIS — I480 Paroxysmal atrial fibrillation: Secondary | ICD-10-CM | POA: Insufficient documentation

## 2023-11-06 DIAGNOSIS — I10 Essential (primary) hypertension: Secondary | ICD-10-CM | POA: Diagnosis not present

## 2023-11-06 DIAGNOSIS — Z79899 Other long term (current) drug therapy: Secondary | ICD-10-CM | POA: Insufficient documentation

## 2023-11-06 DIAGNOSIS — S8011XA Contusion of right lower leg, initial encounter: Secondary | ICD-10-CM | POA: Insufficient documentation

## 2023-11-06 LAB — CBC
HCT: 40.3 % (ref 39.0–52.0)
Hemoglobin: 13.1 g/dL (ref 13.0–17.0)
MCH: 32.3 pg (ref 26.0–34.0)
MCHC: 32.5 g/dL (ref 30.0–36.0)
MCV: 99.3 fL (ref 80.0–100.0)
Platelets: 151 10*3/uL (ref 150–400)
RBC: 4.06 MIL/uL — ABNORMAL LOW (ref 4.22–5.81)
RDW: 12.9 % (ref 11.5–15.5)
WBC: 4.3 10*3/uL (ref 4.0–10.5)
nRBC: 0 % (ref 0.0–0.2)

## 2023-11-06 LAB — BASIC METABOLIC PANEL
Anion gap: 6 (ref 5–15)
BUN: 10 mg/dL (ref 8–23)
CO2: 25 mmol/L (ref 22–32)
Calcium: 8.6 mg/dL — ABNORMAL LOW (ref 8.9–10.3)
Chloride: 108 mmol/L (ref 98–111)
Creatinine, Ser: 1.11 mg/dL (ref 0.61–1.24)
GFR, Estimated: >60 mL/min (ref >60)
Glucose, Bld: 106 mg/dL — ABNORMAL HIGH (ref 70–99)
Potassium: 3.7 mmol/L (ref 3.5–5.1)
Sodium: 139 mmol/L (ref 135–145)

## 2023-11-06 NOTE — ED Triage Notes (Signed)
Pt c/o 2+ swelling of right leg. Pt's entire hamstring is ecchymotic. Pt has fluid on right knee. Pt states went to [facility] yesterday and it showed a blood clot in right lower leg. Pt denies SOB.

## 2023-11-07 NOTE — Discharge Instructions (Signed)
 You have been seen today for your complaint of leg bruising.  This is likely secondary to a quadriceps strain or small tear while on a blood thinner. Your lab work was reassuring. Your imaging showed a likely chronic blood clot in your calf.  This is not contributing to your bruising. Home care instructions are as follows:  Apply compression to the leg.  Apply ice intermittently throughout the day.  This will likely take multiple weeks to fully go away Follow up with: Your vascular surgeon Please seek immediate medical care if you develop any of the following symptoms: Your hematoma is in your chest or belly and you: Pass out. Feel weak. Become short of breath. You have a hematoma on your scalp that is caused by a fall or injury, and you: Have a headache that gets worse. Have trouble speaking or understanding speech. Become less alert or you pass out. At this time there does not appear to be the presence of an emergent medical condition, however there is always the potential for conditions to change. Please read and follow the below instructions.  Do not take your medicine if  develop an itchy rash, swelling in your mouth or lips, or difficulty breathing; call 911 and seek immediate emergency medical attention if this occurs.  You may review your lab tests and imaging results in their entirety on your MyChart account.  Please discuss all results of fully with your primary care provider and other specialist at your follow-up visit.  Note: Portions of this text may have been transcribed using voice recognition software. Every effort was made to ensure accuracy; however, inadvertent computerized transcription errors may still be present.

## 2023-11-07 NOTE — ED Notes (Signed)
Patient reevaluated at triage , unable to appreciate right foot pulses using doppler.

## 2023-11-07 NOTE — ED Provider Notes (Signed)
 [place] [facility] DE[facility] AT [facility] Provider Note   CSN: [id number] Arrival date & time: 11/06/23  1655     History  No chief complaint on file.   Kyle Ryan is a 63 y.o. male.  With a history of DVT, paroxysmal A-fib on Xarelto , hypertension presenting to the ED for evaluation of a hematoma to the right upper leg.  He states he was bending forward to pick up some laundry 2 weeks ago when he felt a pop in the right leg.  He had pain immediately afterwards.  He states a couple days ago he was walking by a mirror and noticed a bruise to the right posterior leg.  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  He presents today requesting clarification on his diagnosis.  He is concerned that the hematoma to his leg is caused by large blood clots.  He reports compliance with his Xarelto  and has not missed any doses.  He reports an intermittent sharp pain to the posterior of the upper leg.  He denies any pain at rest.  No fevers or chills.  No chest pain or shortness of breath.  No cough.  He also reports swelling to the anterior of the right knee.  He has seen orthopedics for this and has had the swelling drained twice.  Reports he was told that it was a bursa sac.  He denies any knee pain.  No erythema.  HPI     Home Medications Prior to Admission medications   Medication Sig Start Date End Date Taking? Authorizing Provider  amLODipine-valsartan (EXFORGE) 10-160 MG tablet Take 1 tablet by mouth daily. 12/12/16   [provider]  atorvastatin (LIPITOR) 20 MG tablet  03/02/18   [provider]  metoprolol  succinate (TOPROL -XL) 50 MG 24 hr tablet Take 50 mg by mouth daily. Take with or immediately following a meal.    [provider]  rivaroxaban  (XARELTO ) 20 MG TABS tablet Take 1 tablet (20 mg total) by mouth daily with supper. 09/24/18   Hilty,  Vinie BROCKS, MD  Tamsulosin  HCl (FLOMAX ) 0.4 MG CAPS Take 0.4 mg by mouth daily after supper.      [provider]      Allergies    Flagyl [metronidazole hcl]    Review of Systems   Review of Systems  Musculoskeletal:  Positive for myalgias.  Skin:  Positive for color change.  All other systems reviewed and are negative.   Physical Exam Updated Vital Signs BP (!) 125/93 (BP Location: Right Arm)   Pulse 66   Temp 97.7 F (36.5 C) (Oral)   Resp 18   Ht 6' 5 (1.956 m)   Wt 97.5 kg   SpO2 100%   BMI 25.49 kg/m  Physical Exam Vitals and nursing note reviewed.  Constitutional:      General: He is not in acute distress.    Appearance: Normal appearance. He is normal weight. He is not ill-appearing.  HENT:     Head: Normocephalic and atraumatic.  Cardiovascular:     Comments: DP pulses 2+ bilaterally.   Pulmonary:     Effort: Pulmonary effort is normal. No respiratory distress.  Abdominal:     General: Abdomen is flat.  Musculoskeletal:        General: Normal range of motion.     Cervical back: Neck supple.     Comments:  Sensation intact in all digits.  Ambulatory without difficulty.  Full AROM of bilateral hips, knees and ankles.  Capillary refill normal.  Skin:    General: Skin is warm and dry.          Comments: Ecchymosis to the right posterior upper leg.  No induration.  No erythema.  Compartments are soft.  Does not extend into the calf.  No anterior bruising.  Neurological:     Mental Status: He is alert and oriented to person, place, and time.  Psychiatric:        Mood and Affect: Mood normal.        Behavior: Behavior normal.     ED Results / Procedures / Treatments   Labs (all labs ordered are listed, but only abnormal results are displayed) Labs Reviewed  BASIC METABOLIC PANEL - Abnormal; Notable for the following components:      Result Value   Glucose, Bld 106 (*)    Calcium 8.6 (*)    All other components within normal limits  CBC -  Abnormal; Notable for the following components:   RBC 4.06 (*)    All other components within normal limits    EKG None  Radiology US  Venous Img Lower Right (DVT Study) Result Date: 11/05/2023 CLINICAL DATA:  Right lower extremity pain. History of previous DVT. Evaluate for acute or chronic DVT. EXAM: RIGHT LOWER EXTREMITY VENOUS DOPPLER ULTRASOUND TECHNIQUE: Gray-scale sonography with graded compression, as well as color Doppler and duplex ultrasound were performed to evaluate the lower extremity deep venous systems from the level of the common femoral vein and including the common femoral, femoral, profunda femoral, popliteal and calf veins including the posterior tibial, peroneal and gastrocnemius veins when visible. The superficial great saphenous vein was also interrogated. Spectral Doppler was utilized to evaluate flow at rest and with distal augmentation maneuvers in the common femoral, femoral and popliteal veins. COMPARISON:  Right lower extremity venous Doppler ultrasound-01/04/2020 FINDINGS: Contralateral Common Femoral Vein: Respiratory phasicity is normal and symmetric with the symptomatic side. No evidence of thrombus. Normal compressibility. Common Femoral Vein: No evidence of thrombus. Normal compressibility, respiratory phasicity and response to augmentation. Saphenofemoral Junction: No evidence of thrombus. Normal compressibility and flow on color Doppler imaging. Profunda Femoral Vein: No evidence of thrombus. Normal compressibility and flow on color Doppler imaging. Femoral Vein: While the proximal aspect of the right femoral vein appears patent (image 12), there is mixed echogenic nonocclusive chronic DVT/wall thickening involving the mid (image 16) and distal (image 20) aspects of the right femoral vein, unchanged to slightly improved compared to the 2021 examination Popliteal Vein: Redemonstrated chronic occlusive thrombus within the right popliteal vein (image 25, unchanged  compared to the 2021 examination. Calf Veins: There is hypoechoic occlusive thrombus involving the right posterior tibial vein (images 33 and 34), likely progressed compared to remote examination performed in 2021. The right peroneal veins appear patent where imaged. Superficial Great Saphenous Vein: No evidence of thrombus. Normal compressibility. Other Findings:  None. IMPRESSION: 1. Occlusive DVT involving the right posterior tibial vein, potentially progressed compared to the 2021 examination. In the absence of more recent prior examinations, an acute on chronic process is not excluded. 2. Redemonstrated chronic mixed occlusive and nonocclusive DVT/wall thickening involving the mid and distal aspects of the right femoral vein as well as the right popliteal vein, overall similar to the 2021 examination. Electronically Signed   By: Norleen Roulette M.D.   On: 11/05/2023 12:33    Procedures Procedures  Medications Ordered in ED Medications - No data to display  ED Course/ Medical Decision Making/ A&P                                 Medical Decision Making Amount and/or Complexity of Data Reviewed Labs: ordered.  This patient presents to the ED for concern of bruising to right leg, this involves an extensive number of treatment options, and is a complaint that carries with it a high risk of complications and morbidity.  The differential diagnosis includes DVT, hamstring strain/tendon rupture, hematoma, DIC  My initial workup includes labs  Additional history obtained from: Nursing notes from this visit. Previous records within EMR system ED visit for same on 11/05/2023  I ordered, reviewed and interpreted labs which include: CBC, BMP.  No leukocytosis or anemia.  No electrolyte derangement or kidney dysfunction  I reviewed imaging studies including RLE DVT study I independently visualized and interpreted imaging which showed chronic appearing posterior tibial DVT I agree with the  radiologist interpretation  Afebrile, hemodynamically stable.  63 year old male presenting for evaluation of bruising to the right lower extremity.  This is in the posterior aspect of the upper leg.  He he noticed this approximately 3 days ago.  Presented to med [facility] emergency department at that time.  Had a DVT study which showed likely chronic DVT.  He is on Xarelto  and reports compliance with this medication.  At that time he was encouraged to apply compression to the leg and heat.  He was also encouraged to follow-up with his vascular surgeon.  He returns to the emergency department today requesting clarification of his diagnosis.  Patient does appear to have a hematoma to the right upper leg.  No evidence of compartment syndrome, cellulitis or septic hematoma.  He has strong palpable pedal pulses, however was still concerned about a vascular occlusion so I used a Doppler at bedside for reassurance which revealed strong pulses.  Lab workup was reassuring.  He has an appointment with his vascular surgeon in February.  He was again encouraged to apply compression and heat.  He was educated on typical timeline of resolution of symptoms and hematoma of this size.  He denies any signs or symptoms concerning for PE.  Regarding his swelling to the knee, this is a chronic issue and not painful or infectious appearing.  He was encouraged to follow-up with his orthopedic provider for continued management of this.  Patient voices understanding with the plan.  He was given return precautions.  Stable discharge.  At this time there does not appear to be any evidence of an acute emergency medical condition and the patient appears stable for discharge with appropriate outpatient follow up. Diagnosis was discussed with patient who verbalizes understanding of care plan and is agreeable to discharge. I have discussed return precautions with patient who verbalizes understanding. Patient encouraged to follow-up  with their PCP within 1 week. All questions answered.  Note: Portions of this report may have been transcribed using voice recognition software. Every effort was made to ensure accuracy; however, inadvertent computerized transcription errors may still be present.         Final Clinical Impression(s) / ED Diagnoses Final diagnoses:  Hematoma of right lower extremity, initial encounter    Rx / DC Orders ED Discharge Orders     None         Edwardo Marsa HERO, DEVONNA 11/07/23  9543    Haze Lonni PARAS, MD 11/07/23 [phone]

## 2023-11-07 NOTE — ED Notes (Signed)
 ED Provider at bedside.

## 2023-12-25 ENCOUNTER — Encounter (HOSPITAL_COMMUNITY): Payer: Self-pay
